# Patient Record
Sex: Female | Born: 1970 | Race: White | Hispanic: No | Marital: Married | Smoking: Never smoker
Health system: Southern US, Community
[De-identification: ages and names within clinical notes are randomized; demographics above are authoritative.]

## PROBLEM LIST (undated history)

## (undated) DIAGNOSIS — I493 Ventricular premature depolarization: Secondary | ICD-10-CM

## (undated) DIAGNOSIS — I10 Essential (primary) hypertension: Secondary | ICD-10-CM

## (undated) DIAGNOSIS — IMO0001 Reserved for inherently not codable concepts without codable children: Secondary | ICD-10-CM

## (undated) DIAGNOSIS — IMO0002 Reserved for concepts with insufficient information to code with codable children: Secondary | ICD-10-CM

## (undated) HISTORY — DX: Essential (primary) hypertension: I10

## (undated) HISTORY — PX: MOUTH SURGERY: SHX715

## (undated) HISTORY — DX: Reserved for inherently not codable concepts without codable children: IMO0001

## (undated) HISTORY — PX: WISDOM TOOTH EXTRACTION: SHX21

## (undated) HISTORY — DX: Reserved for concepts with insufficient information to code with codable children: IMO0002

## (undated) HISTORY — DX: Ventricular premature depolarization: I49.3

---

## 1997-08-18 ENCOUNTER — Other Ambulatory Visit: Admission: RE | Admit: 1997-08-18 | Discharge: 1997-08-18 | Payer: Self-pay | Admitting: Gynecology

## 1997-10-27 ENCOUNTER — Other Ambulatory Visit: Admission: RE | Admit: 1997-10-27 | Discharge: 1997-10-27 | Payer: Self-pay | Admitting: Gynecology

## 1998-05-13 ENCOUNTER — Other Ambulatory Visit: Admission: RE | Admit: 1998-05-13 | Discharge: 1998-05-13 | Payer: Self-pay | Admitting: Gynecology

## 1998-06-10 ENCOUNTER — Other Ambulatory Visit: Admission: RE | Admit: 1998-06-10 | Discharge: 1998-06-10 | Payer: Self-pay | Admitting: Gynecology

## 1999-07-06 ENCOUNTER — Other Ambulatory Visit: Admission: RE | Admit: 1999-07-06 | Discharge: 1999-07-06 | Payer: Self-pay | Admitting: Gynecology

## 1999-09-26 ENCOUNTER — Other Ambulatory Visit: Admission: RE | Admit: 1999-09-26 | Discharge: 1999-09-26 | Payer: Self-pay | Admitting: Gynecology

## 1999-11-08 ENCOUNTER — Other Ambulatory Visit: Admission: RE | Admit: 1999-11-08 | Discharge: 1999-11-08 | Payer: Self-pay | Admitting: Gynecology

## 1999-11-08 ENCOUNTER — Encounter (INDEPENDENT_AMBULATORY_CARE_PROVIDER_SITE_OTHER): Payer: Self-pay | Admitting: Specialist

## 2000-02-07 DIAGNOSIS — IMO0002 Reserved for concepts with insufficient information to code with codable children: Secondary | ICD-10-CM

## 2000-02-07 HISTORY — DX: Reserved for concepts with insufficient information to code with codable children: IMO0002

## 2000-02-07 HISTORY — PX: CERVICAL BIOPSY  W/ LOOP ELECTRODE EXCISION: SUR135

## 2000-05-31 ENCOUNTER — Other Ambulatory Visit: Admission: RE | Admit: 2000-05-31 | Discharge: 2000-05-31 | Payer: Self-pay | Admitting: Gynecology

## 2000-08-22 ENCOUNTER — Other Ambulatory Visit: Admission: RE | Admit: 2000-08-22 | Discharge: 2000-08-22 | Payer: Self-pay | Admitting: Gynecology

## 2000-08-22 ENCOUNTER — Encounter (INDEPENDENT_AMBULATORY_CARE_PROVIDER_SITE_OTHER): Payer: Self-pay

## 2001-10-30 ENCOUNTER — Other Ambulatory Visit: Admission: RE | Admit: 2001-10-30 | Discharge: 2001-10-30 | Payer: Self-pay | Admitting: Gynecology

## 2002-04-10 ENCOUNTER — Other Ambulatory Visit: Admission: RE | Admit: 2002-04-10 | Discharge: 2002-04-10 | Payer: Self-pay | Admitting: Gynecology

## 2002-11-11 ENCOUNTER — Other Ambulatory Visit: Admission: RE | Admit: 2002-11-11 | Discharge: 2002-11-11 | Payer: Self-pay | Admitting: Gynecology

## 2003-07-23 ENCOUNTER — Encounter: Admission: RE | Admit: 2003-07-23 | Discharge: 2003-10-21 | Payer: Self-pay | Admitting: Psychology

## 2003-12-23 ENCOUNTER — Other Ambulatory Visit: Admission: RE | Admit: 2003-12-23 | Discharge: 2003-12-23 | Payer: Self-pay | Admitting: Gynecology

## 2004-12-26 ENCOUNTER — Other Ambulatory Visit: Admission: RE | Admit: 2004-12-26 | Discharge: 2004-12-26 | Payer: Self-pay | Admitting: Gynecology

## 2005-10-19 ENCOUNTER — Encounter: Admission: RE | Admit: 2005-10-19 | Discharge: 2005-10-19 | Payer: Self-pay | Admitting: Internal Medicine

## 2006-01-02 ENCOUNTER — Other Ambulatory Visit: Admission: RE | Admit: 2006-01-02 | Discharge: 2006-01-02 | Payer: Self-pay | Admitting: Gynecology

## 2007-01-10 ENCOUNTER — Other Ambulatory Visit: Admission: RE | Admit: 2007-01-10 | Discharge: 2007-01-10 | Payer: Self-pay | Admitting: Gynecology

## 2008-09-30 ENCOUNTER — Other Ambulatory Visit: Admission: RE | Admit: 2008-09-30 | Discharge: 2008-09-30 | Payer: Self-pay | Admitting: Family Medicine

## 2009-12-20 ENCOUNTER — Other Ambulatory Visit: Admission: RE | Admit: 2009-12-20 | Discharge: 2009-12-20 | Payer: Self-pay | Admitting: Gynecology

## 2009-12-20 ENCOUNTER — Ambulatory Visit: Payer: Self-pay | Admitting: Gynecology

## 2010-02-09 ENCOUNTER — Ambulatory Visit: Admit: 2010-02-09 | Payer: Self-pay | Admitting: Gynecology

## 2010-02-18 ENCOUNTER — Ambulatory Visit: Admit: 2010-02-18 | Payer: Self-pay | Admitting: Gynecology

## 2010-02-23 ENCOUNTER — Ambulatory Visit
Admission: RE | Admit: 2010-02-23 | Discharge: 2010-02-23 | Payer: Self-pay | Source: Home / Self Care | Attending: Gynecology | Admitting: Gynecology

## 2010-02-23 DIAGNOSIS — IMO0001 Reserved for inherently not codable concepts without codable children: Secondary | ICD-10-CM

## 2010-02-23 HISTORY — DX: Reserved for inherently not codable concepts without codable children: IMO0001

## 2010-03-21 ENCOUNTER — Other Ambulatory Visit: Payer: Self-pay | Admitting: Dermatology

## 2010-03-24 ENCOUNTER — Ambulatory Visit (INDEPENDENT_AMBULATORY_CARE_PROVIDER_SITE_OTHER): Payer: 59 | Admitting: Gynecology

## 2010-03-24 DIAGNOSIS — IMO0002 Reserved for concepts with insufficient information to code with codable children: Secondary | ICD-10-CM

## 2010-11-07 HISTORY — PX: OTHER SURGICAL HISTORY: SHX169

## 2010-12-22 DIAGNOSIS — F988 Other specified behavioral and emotional disorders with onset usually occurring in childhood and adolescence: Secondary | ICD-10-CM | POA: Insufficient documentation

## 2010-12-22 DIAGNOSIS — IMO0002 Reserved for concepts with insufficient information to code with codable children: Secondary | ICD-10-CM | POA: Insufficient documentation

## 2010-12-28 ENCOUNTER — Encounter: Payer: 59 | Admitting: Gynecology

## 2011-02-14 ENCOUNTER — Encounter: Payer: 59 | Admitting: Gynecology

## 2011-02-14 ENCOUNTER — Encounter: Payer: Self-pay | Admitting: Gynecology

## 2011-02-14 ENCOUNTER — Ambulatory Visit (INDEPENDENT_AMBULATORY_CARE_PROVIDER_SITE_OTHER): Payer: BC Managed Care – PPO | Admitting: Gynecology

## 2011-02-14 ENCOUNTER — Other Ambulatory Visit (HOSPITAL_COMMUNITY)
Admission: RE | Admit: 2011-02-14 | Discharge: 2011-02-14 | Disposition: A | Discharge status: Home / Self Care | Payer: BC Managed Care – PPO | Source: Ambulatory Visit | Attending: Gynecology | Admitting: Gynecology

## 2011-02-14 VITALS — BP 122/76 | Ht 67.0 in | Wt 154.0 lb

## 2011-02-14 DIAGNOSIS — Z01419 Encounter for gynecological examination (general) (routine) without abnormal findings: Secondary | ICD-10-CM

## 2011-02-14 DIAGNOSIS — Z30431 Encounter for routine checking of intrauterine contraceptive device: Secondary | ICD-10-CM

## 2011-02-14 DIAGNOSIS — Z13220 Encounter for screening for lipoid disorders: Secondary | ICD-10-CM

## 2011-02-14 DIAGNOSIS — IMO0002 Reserved for concepts with insufficient information to code with codable children: Secondary | ICD-10-CM

## 2011-02-14 DIAGNOSIS — R87613 High grade squamous intraepithelial lesion on cytologic smear of cervix (HGSIL): Secondary | ICD-10-CM

## 2011-02-14 DIAGNOSIS — L680 Hirsutism: Secondary | ICD-10-CM

## 2011-02-14 DIAGNOSIS — Z131 Encounter for screening for diabetes mellitus: Secondary | ICD-10-CM

## 2011-02-14 DIAGNOSIS — Z23 Encounter for immunization: Secondary | ICD-10-CM

## 2011-02-14 LAB — URINALYSIS, ROUTINE W REFLEX MICROSCOPIC
Glucose, UA: NEGATIVE mg/dL
Hgb urine dipstick: NEGATIVE
Leukocytes, UA: NEGATIVE
Specific Gravity, Urine: 1.02 (ref 1.005–1.030)
pH: 6 (ref 5.0–8.0)

## 2011-02-14 NOTE — Progress Notes (Signed)
Addended by: Richardson Chiquito on: 02/14/2011 04:48 PM   Modules accepted: Orders

## 2011-02-14 NOTE — Progress Notes (Addendum)
Emma Barajas 31-Jan-1971 [id number]        41 y.o.  for annual exam.  Several issues noted below.  Past medical history,surgical history, medications, allergies, family history and social history were all reviewed and documented in the EPIC chart. ROS:  Was performed and pertinent positives and negatives are included in the history.  Exam: Sherri chaperone present Filed Vitals:   02/14/11 1018  BP: 122/76   General appearance  Normal Skin grossly normal Head/Neck normal with no cervical or supraclavicular adenopathy thyroid normal Lungs  clear Cardiac RR, without RMG Abdominal  soft, nontender, without masses, organomegaly or hernia Breasts  examined lying and sitting without masses, retractions, discharge or axillary adenopathy. Pelvic  Ext/BUS/vagina  normal   Cervix  normal  Pap done. IUD string visualized and trimmed   Uterus  anteverted, normal size, shape and contour, midline and mobile nontender   Adnexa  Without masses or tenderness    Anus and perineum  normal   Rectovaginal  normal sphincter tone without palpated masses or tenderness.    Assessment/Plan:  41 y.o. female for annual exam.    1. IUDs management. Patient relates her husband feels the IUD string with intercourse. On exam the string is right at the external os. Using the colposcope and biopsy instrument I trimmed the string into the opening of the external os. Patient will follow up if her husband continues to have discomfort. Otherwise she is doing well with the IUD. 2. 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 Her intervening Pap smears have been normal. A Pap was done today and will continue with annual cytology. 3. Facial hair growth. Patient notes in the last 6 months some increased facial chin and upper lip hair growth. No other symptoms such as scalp hair loss, chest hair, increased acne, weight gain. I suspect this is more physiologic. It may be related to the  newer IUD placement although she did not have this issue with her prior IUD. We'll check baseline TSH and testosterone. Assuming normal then we'll follow. If it worsens then we'll consider referral. 4. Breast health. SBE monthly reviewed. Mammogram last year and recommended repeat this year and she agrees to schedule this. 5. Health maintenance. Baseline CBC glucose lipid profile and urinalysis were ordered. Assuming she continues well then she will see me in a year, sooner as needed.    [name] MD, 10:48 AM 02/14/2011

## 2011-02-14 NOTE — Patient Instructions (Signed)
Follow up if IUD string continues to be an issue. Schedule mammogram this year and follow up in one year for annual GYN exam.

## 2011-02-15 LAB — CBC WITH DIFFERENTIAL/PLATELET
Basophils Absolute: 0 10*3/uL (ref 0.0–0.1)
Eosinophils Absolute: 0.1 10*3/uL (ref 0.0–0.7)
Lymphocytes Relative: 38 % (ref 12–46)
Lymphs Abs: 1.5 10*3/uL (ref 0.7–4.0)
MCH: 29.9 pg (ref 26.0–34.0)
Neutrophils Relative %: 50 % (ref 43–77)
Platelets: 211 10*3/uL (ref 150–400)
RBC: 4.25 MIL/uL (ref 3.87–5.11)
RDW: 14.4 % (ref 11.5–15.5)
WBC: 4.1 10*3/uL (ref 4.0–10.5)

## 2011-02-15 LAB — GLUCOSE, RANDOM: Glucose, Bld: 75 mg/dL (ref 70–99)

## 2011-02-15 LAB — TESTOSTERONE: Testosterone: 43.7 ng/dL (ref 10–70)

## 2011-02-15 LAB — TSH: TSH: 0.584 u[IU]/mL (ref 0.350–4.500)

## 2011-02-15 LAB — LIPID PANEL: HDL: 92 mg/dL (ref >39)

## 2011-04-21 ENCOUNTER — Encounter: Payer: Self-pay | Admitting: Gynecology

## 2012-02-26 ENCOUNTER — Encounter: Payer: Self-pay | Admitting: Gynecology

## 2012-02-26 ENCOUNTER — Other Ambulatory Visit (HOSPITAL_COMMUNITY)
Admission: RE | Admit: 2012-02-26 | Discharge: 2012-02-26 | Disposition: A | Discharge status: Home / Self Care | Payer: BC Managed Care – PPO | Source: Ambulatory Visit | Attending: Gynecology | Admitting: Gynecology

## 2012-02-26 ENCOUNTER — Ambulatory Visit (INDEPENDENT_AMBULATORY_CARE_PROVIDER_SITE_OTHER): Payer: BC Managed Care – PPO | Admitting: Gynecology

## 2012-02-26 VITALS — BP 120/74 | Ht 67.0 in | Wt 155.0 lb

## 2012-02-26 DIAGNOSIS — Z01419 Encounter for gynecological examination (general) (routine) without abnormal findings: Secondary | ICD-10-CM

## 2012-02-26 DIAGNOSIS — Z1151 Encounter for screening for human papillomavirus (HPV): Secondary | ICD-10-CM | POA: Insufficient documentation

## 2012-02-26 DIAGNOSIS — Z30431 Encounter for routine checking of intrauterine contraceptive device: Secondary | ICD-10-CM

## 2012-02-26 DIAGNOSIS — Z13220 Encounter for screening for lipoid disorders: Secondary | ICD-10-CM

## 2012-02-26 DIAGNOSIS — R1013 Epigastric pain: Secondary | ICD-10-CM

## 2012-02-26 LAB — CBC WITH DIFFERENTIAL/PLATELET
Basophils Absolute: 0.1 10*3/uL (ref 0.0–0.1)
Lymphocytes Relative: 24 % (ref 12–46)
Lymphs Abs: 1.8 10*3/uL (ref 0.7–4.0)
MCV: 86.6 fL (ref 78.0–100.0)
Neutro Abs: 5.2 10*3/uL (ref 1.7–7.7)
Neutrophils Relative %: 67 % (ref 43–77)
Platelets: 243 10*3/uL (ref 150–400)
RBC: 4.76 MIL/uL (ref 3.87–5.11)
RDW: 15.1 % (ref 11.5–15.5)
WBC: 7.6 10*3/uL (ref 4.0–10.5)

## 2012-02-26 LAB — LIPID PANEL
Cholesterol: 198 mg/dL (ref 0–200)
Total CHOL/HDL Ratio: 2.9 Ratio
VLDL: 22 mg/dL (ref 0–40)

## 2012-02-26 LAB — COMPREHENSIVE METABOLIC PANEL
ALT: 11 U/L (ref 0–35)
AST: 19 U/L (ref 0–37)
CO2: 26 mEq/L (ref 19–32)
Calcium: 9.6 mg/dL (ref 8.4–10.5)
Chloride: 100 mEq/L (ref 96–112)
Potassium: 4.6 mEq/L (ref 3.5–5.3)
Sodium: 137 mEq/L (ref 135–145)
Total Protein: 7.3 g/dL (ref 6.0–8.3)

## 2012-02-26 NOTE — Progress Notes (Signed)
Emma Barajas 05-09-1970 [id number]        42 y.o.  G0P0 for annual exam.  Several issues noted below.  Past medical history,surgical history, medications, allergies, family history and social history were all reviewed and documented in the EPIC chart. ROS:  Was performed and pertinent positives and negatives are included in the history.  Exam: [name] Filed Vitals:   02/26/12 0851  BP: 120/74  Height: 5\' 7"  (1.702 m)  Weight: 155 lb (70.308 kg)   General appearance  Normal Skin grossly normal Head/Neck normal with no cervical or supraclavicular adenopathy thyroid normal Lungs  clear Cardiac RR, without RMG Abdominal  soft, nontender, without masses, organomegaly or hernia Breasts  examined lying and sitting without masses, retractions, discharge or axillary adenopathy. Pelvic  Ext/BUS/vagina  normal   Cervix  normal with IUD string visualized./HPV  Uterus  anteverted, normal size, shape and contour, midline and mobile nontender   Adnexa  Without masses or tenderness    Anus and perineum  normal   Rectovaginal  normal sphincter tone without palpated masses or tenderness.    Assessment/Plan:  42 y.o. G0P0 female for annual exam.   1. Mirena IUD January 2012. Doing well with light menses. Continue to follow. 2. Pap smear January 2013. Pap/HPV done today given history of high-grade dysplasia 2006 status post the LEEP with normal Pap smears since. Assuming this Paps normal them plan less frequent screening interval. 3. Mammography 04/2011. Follow up mammogram this year. SBE monthly reviewed. 4. Episodic epigastric pain times several episodes. Lasting hours today. Cramping bloating. May follow meals although hasn't time to. No continued nausea vomiting diarrhea constipation fever chills or other symptoms. Check baseline liver functions and upper abdominal ultrasound rule out cholelithiasis. If evidence of cholelithiasis referral to general surgery. If negative referral to  gastroenterology. Alternatives such as GERD/ulcerative disease or other pathology reviewed. 5. Health maintenance. Baseline CBC comprehensive metabolic panel lipid profile urinalysis ordered. Follow up per above.    Emma Barajas, 9:26 AM 02/26/2012

## 2012-02-26 NOTE — Addendum Note (Signed)
Addended by: Dayna Barker on: 02/26/2012 09:49 AM   Modules accepted: Orders

## 2012-02-26 NOTE — Patient Instructions (Signed)
Office will call to arrange abdominal ultrasound. Follow up for annual gynecologic exam

## 2012-02-27 LAB — URINALYSIS W MICROSCOPIC + REFLEX CULTURE
Bilirubin Urine: NEGATIVE
Crystals: NONE SEEN
Glucose, UA: NEGATIVE mg/dL
Leukocytes, UA: NEGATIVE
Protein, ur: NEGATIVE mg/dL
Specific Gravity, Urine: 1.019 (ref 1.005–1.030)
Squamous Epithelial / LPF: NONE SEEN
Urobilinogen, UA: 0.2 mg/dL (ref 0.0–1.0)

## 2012-03-23 ENCOUNTER — Other Ambulatory Visit: Payer: Self-pay

## 2012-04-23 ENCOUNTER — Encounter: Payer: Self-pay | Admitting: Gynecology

## 2012-12-12 ENCOUNTER — Other Ambulatory Visit: Payer: Self-pay

## 2013-03-21 ENCOUNTER — Ambulatory Visit (INDEPENDENT_AMBULATORY_CARE_PROVIDER_SITE_OTHER): Payer: BC Managed Care – PPO | Admitting: Gynecology

## 2013-03-21 ENCOUNTER — Encounter: Payer: Self-pay | Admitting: Gynecology

## 2013-03-21 VITALS — BP 116/72 | Ht 67.0 in | Wt 156.0 lb

## 2013-03-21 DIAGNOSIS — Z01419 Encounter for gynecological examination (general) (routine) without abnormal findings: Secondary | ICD-10-CM

## 2013-03-21 LAB — COMPREHENSIVE METABOLIC PANEL
ALBUMIN: 4.3 g/dL (ref 3.5–5.2)
ALT: 8 U/L (ref 0–35)
AST: 17 U/L (ref 0–37)
Alkaline Phosphatase: 58 U/L (ref 39–117)
BUN: 15 mg/dL (ref 6–23)
CALCIUM: 9.1 mg/dL (ref 8.4–10.5)
CHLORIDE: 104 meq/L (ref 96–112)
CO2: 26 mEq/L (ref 19–32)
Creat: 0.72 mg/dL (ref 0.50–1.10)
Glucose, Bld: 60 mg/dL — ABNORMAL LOW (ref 70–99)
POTASSIUM: 3.8 meq/L (ref 3.5–5.3)
SODIUM: 141 meq/L (ref 135–145)
Total Bilirubin: 0.3 mg/dL (ref 0.2–1.2)
Total Protein: 6.6 g/dL (ref 6.0–8.3)

## 2013-03-21 LAB — LIPID PANEL
Cholesterol: 168 mg/dL (ref 0–200)
HDL: 71 mg/dL (ref >39)
LDL CALC: 73 mg/dL (ref 0–99)
Total CHOL/HDL Ratio: 2.4 Ratio
Triglycerides: 118 mg/dL (ref <150)
VLDL: 24 mg/dL (ref 0–40)

## 2013-03-21 LAB — CBC WITH DIFFERENTIAL/PLATELET
BASOS ABS: 0 10*3/uL (ref 0.0–0.1)
Basophils Relative: 1 % (ref 0–1)
Eosinophils Absolute: 0.1 10*3/uL (ref 0.0–0.7)
Eosinophils Relative: 1 % (ref 0–5)
HEMATOCRIT: 40.3 % (ref 36.0–46.0)
Hemoglobin: 13.3 g/dL (ref 12.0–15.0)
LYMPHS PCT: 40 % (ref 12–46)
Lymphs Abs: 1.8 10*3/uL (ref 0.7–4.0)
MCH: 29.3 pg (ref 26.0–34.0)
MCHC: 33 g/dL (ref 30.0–36.0)
MCV: 88.8 fL (ref 78.0–100.0)
Monocytes Absolute: 0.3 10*3/uL (ref 0.1–1.0)
Monocytes Relative: 7 % (ref 3–12)
NEUTROS ABS: 2.3 10*3/uL (ref 1.7–7.7)
NEUTROS PCT: 51 % (ref 43–77)
PLATELETS: 216 10*3/uL (ref 150–400)
RBC: 4.54 MIL/uL (ref 3.87–5.11)
RDW: 15.1 % (ref 11.5–15.5)
WBC: 4.5 10*3/uL (ref 4.0–10.5)

## 2013-03-21 NOTE — Patient Instructions (Signed)
Follow up in one year for annual exam 

## 2013-03-21 NOTE — Progress Notes (Addendum)
Emma ChurnLea Barajas 03/27/1970 [id number]        43 y.o.  G0P0 for annual exam.  Doing well without complaints.  Past medical history,surgical history, problem list, medications, allergies, family history and social history were all reviewed and documented in the EPIC chart.  ROS:  Performed and pertinent positives and negatives are included in the history, assessment and plan .  Exam: [name] Filed Vitals:   03/21/13 0827  BP: 116/72  Height: 5\' 7"  (1.702 m)  Weight: 156 lb (70.761 kg)   General appearance  Normal Skin grossly normal Head/Neck normal with no cervical or supraclavicular adenopathy thyroid normal Lungs  clear Cardiac premature beats every third to fourth beat, without RMG Abdominal  soft, nontender, without masses, organomegaly or hernia Breasts  examined lying and sitting without masses, retractions, discharge or axillary adenopathy. Pelvic  Ext/BUS/vagina  Normal  Cervix  Normal. IUD string visualized within endocervical canal using colposcope  Uterus  anteverted, normal size, shape and contour, midline and mobile nontender   Adnexa  Without masses or tenderness    Anus and perineum  Normal   Rectovaginal  Normal sphincter tone without palpated masses or tenderness.    Assessment/Plan:  43 y.o. G0P0 female for annual exam amenorrhea, Mirena IUD.   1. Mirena IUD 02/2010. String visualized within the endocervical canal. Doing well and amenorrheic. Continue to monitor. 2. Pap smear/HPV negative 02/2012. No Pap smear done today. History of high-grade dysplasia with LEEP 2002. Negative Pap smears since then. Plan repeat at 3 year interval. 3. Mammography 04/2012. Patient has it scheduled next month. SBE monthly reviewed. 4. Health maintenance. Baseline CBC comprehensive metabolic panel lipid profile urinalysis ordered. Followup one year, sooner as needed.  Addendum to above: Patient did have some premature beats on cardiac auscultation and pulse monitoring. We'll have  her see a cardiologist for followup exam as she's never been told this before.   Note: This document was prepared with digital dictation and possible smart phrase technology. Any transcriptional errors that result from this process are unintentional.   Dara LordsFONTAINE,TIMOTHY P MD, 8:50 AM 03/21/2013

## 2013-03-22 LAB — URINALYSIS W MICROSCOPIC + REFLEX CULTURE
Bilirubin Urine: NEGATIVE
Casts: NONE SEEN
Crystals: NONE SEEN
GLUCOSE, UA: NEGATIVE mg/dL
Ketones, ur: NEGATIVE mg/dL
NITRITE: POSITIVE — AB
PH: 6 (ref 5.0–8.0)
PROTEIN: NEGATIVE mg/dL
SQUAMOUS EPITHELIAL / LPF: NONE SEEN
Specific Gravity, Urine: 1.008 (ref 1.005–1.030)
Urobilinogen, UA: 0.2 mg/dL (ref 0.0–1.0)

## 2013-03-23 LAB — URINE CULTURE: Colony Count: 2000

## 2013-03-24 ENCOUNTER — Other Ambulatory Visit: Payer: Self-pay | Admitting: Gynecology

## 2013-03-24 ENCOUNTER — Other Ambulatory Visit: Payer: BC Managed Care – PPO

## 2013-03-24 ENCOUNTER — Telehealth: Payer: Self-pay

## 2013-03-24 DIAGNOSIS — R3129 Other microscopic hematuria: Secondary | ICD-10-CM

## 2013-03-24 NOTE — Telephone Encounter (Signed)
Patient said when she was in that you mentioned that her heart did not sound completely normal.  She said you told her she should get it checked out and she is not sure what that entails or what you wanted her to do.

## 2013-03-24 NOTE — Telephone Encounter (Signed)
She is correct. She did seem to have some premature beats in her heart rhythm. Recommend she see a cardiologist for followup. [facility]

## 2013-03-24 NOTE — Telephone Encounter (Signed)
I called to schedule appt with [facility] [phone] but they have already closed due to inclement weather and will reopen on Weds at 10am.  I informed patient of this and that I will try to contact them again on Weds and get her scheduled.

## 2013-03-25 LAB — URINALYSIS W MICROSCOPIC + REFLEX CULTURE
BACTERIA UA: NONE SEEN
Bilirubin Urine: NEGATIVE
CASTS: NONE SEEN
CRYSTALS: NONE SEEN
Glucose, UA: NEGATIVE mg/dL
Hgb urine dipstick: NEGATIVE
LEUKOCYTES UA: NEGATIVE
NITRITE: NEGATIVE
PH: 7 (ref 5.0–8.0)
Protein, ur: NEGATIVE mg/dL
SPECIFIC GRAVITY, URINE: 1.019 (ref 1.005–1.030)
SQUAMOUS EPITHELIAL / LPF: NONE SEEN
UROBILINOGEN UA: 0.2 mg/dL (ref 0.0–1.0)

## 2013-03-26 NOTE — Telephone Encounter (Signed)
Appt scheduled for tomorrow at 2:30pm with Dr. Tobias AlexanderKatarina Barajas.  Patient should arrive by 2:15pm. Patient informed.

## 2013-03-27 ENCOUNTER — Encounter: Payer: Self-pay | Admitting: Cardiology

## 2013-03-27 ENCOUNTER — Ambulatory Visit (INDEPENDENT_AMBULATORY_CARE_PROVIDER_SITE_OTHER): Payer: BC Managed Care – PPO | Admitting: Cardiology

## 2013-03-27 VITALS — BP 128/68 | HR 77 | Ht 67.0 in | Wt 158.0 lb

## 2013-03-27 DIAGNOSIS — I4949 Other premature depolarization: Secondary | ICD-10-CM

## 2013-03-27 DIAGNOSIS — I493 Ventricular premature depolarization: Secondary | ICD-10-CM | POA: Insufficient documentation

## 2013-03-27 NOTE — Patient Instructions (Signed)
Your physician has recommended that you wear a 48 hour holter monitor. Holter monitors are medical devices that record the heart's electrical activity. Doctors most often use these monitors to diagnose arrhythmias. Arrhythmias are problems with the speed or rhythm of the heartbeat. The monitor is a small, portable device. You can wear one while you do your normal daily activities. This is usually used to diagnose what is causing palpitations/syncope (passing out).  Your physician recommends that you schedule a follow-up appointment in: as needed

## 2013-03-27 NOTE — Progress Notes (Signed)
Patient ID: Emma Barajas, female   DOB: [birth year], 43 y.o.   MRN: [id number]     Patient Name: Emma Barajas Date of Encounter: 03/27/2013  Primary Care Provider:  [name], MD Primary Cardiologist:  Tobias Alexander, H  Problem List   Past Medical History  Diagnosis Date  . IUD 02-23-10    ORIGINALLY INSERTED 01/09/05 AND REPLACE ON 02-23-10  . ADD (attention deficit disorder)   . HGSIL (high grade squamous intraepithelial dysplasia) 2002    LEEP   Past Surgical History  Procedure Laterality Date  . Cervical biopsy  w/ loop electrode excision  2002    HGSIL  . Wisdom tooth extraction    . Lasix eye surgery  11/2010  . Intrauterine device insertion  02/2010    Allergies  Allergies  Allergen Reactions  . Other Shortness Of Breath    "PARSNIPS"    HPI  A healthy appearing 43 year old female who was referred to [place] for a concern of PVCs. The patient has no significant PMH. She recently underwent an annual physical by her OB-GYN and was told that she has irregular heart beats. She states that she was also told 18 years ago that she has a murmur. The patient denies any symptoms, she is very active, preparing for a 50 mile walk, currently walking 5 miles daily without any limitations. She denies CP, SOB, palpitations or syncope.   Home Medications  Prior to Admission medications   Medication Sig Start Date End Date Taking? Authorizing Provider  Calcium Carbonate-Vitamin D (CALCIUM + D PO) Take by mouth.   Yes Historical Provider, MD  Coenzyme Q10 (COQ10 PO) Take by mouth.   Yes Historical Provider, MD  levonorgestrel (MIRENA) 20 MCG/24HR IUD 1 each by Intrauterine route once. INSERTED JAN 2012    Yes Historical Provider, MD  Multiple Vitamin (MULTIVITAMIN) tablet Take 1 tablet by mouth daily.   Yes Historical Provider, MD  Omega-3 Fatty Acids (FISH OIL PO) Take by mouth.     Yes Historical Provider, MD  OVER THE COUNTER MEDICATION reservatol     Yes Historical  Provider, MD  valACYclovir (VALTREX) 1000 MG tablet  02/02/13  Yes Historical Provider, MD    Family History  Family History  Problem Relation Age of Onset  . Hyperlipidemia Mother   . Cancer Father     PROSTATE  . Hypertension Father   . Hyperlipidemia Father   . Diabetes Father   . Heart attack Maternal Grandmother     Social History  History   Social History  . Marital Status: Married    Spouse Name: N/A    Number of Children: N/A  . Years of Education: N/A   Occupational History  . Not on file.   Social History Main Topics  . Smoking status: Never Smoker   . Smokeless tobacco: Never Used  . Alcohol Use: 7.5 oz/week    15 drink(s) per week  . Drug Use: No  . Sexual Activity: Yes    Birth Control/ Protection: IUD     Comment: Inserted 02/2010-Mirena   Other Topics Concern  . Not on file   Social History Narrative  . No narrative on file     Review of Systems, as per HPI, otherwise negative General:  No chills, fever, night sweats or weight changes.  Cardiovascular:  No chest pain, dyspnea on exertion, edema, orthopnea, palpitations, paroxysmal nocturnal dyspnea. Dermatological: No rash, lesions/masses Respiratory: No cough, dyspnea Urologic: No hematuria, dysuria Abdominal:  No nausea, vomiting, diarrhea, bright red blood per rectum, melena, or hematemesis Neurologic:  No visual changes, wkns, changes in mental status. All other systems reviewed and are otherwise negative except as noted above.  Physical Exam  Blood pressure 128/68, pulse 77, height 5\' 7"  (1.702 m), weight 158 lb (71.668 kg), last menstrual period 03/09/2013.  General: Pleasant, NAD Psych: Normal affect. Neuro: Alert and oriented X 3. Moves all extremities spontaneously. HEENT: Normal  Neck: Supple without bruits or JVD. Lungs:  Resp regular and unlabored, CTA. Heart: RRR no s3, s4, or murmurs. Abdomen: Soft, non-tender, non-distended, BS + x 4.  Extremities: No clubbing, cyanosis  or edema. DP/PT/Radials 2+ and equal bilaterally.  Labs:  No results found for this basename: CKTOTAL, CKMB, TROPONINI,  in the last 72 hours Lab Results  Component Value Date   WBC 4.5 03/21/2013   HGB 13.3 03/21/2013   HCT 40.3 03/21/2013   MCV 88.8 03/21/2013   PLT 216 03/21/2013    Recent Labs Lab 03/21/13 0853  NA 141  K 3.8  CL 104  CO2 26  BUN 15  CREATININE 0.72  CALCIUM 9.1  PROT 6.6  BILITOT 0.3  ALKPHOS 58  ALT 8  AST 17  GLUCOSE 60*   Lab Results  Component Value Date   CHOL 168 03/21/2013   HDL 71 03/21/2013   LDLCALC 73 03/21/2013   TRIG 118 03/21/2013   Accessory Clinical Findings  Echocardiogram - none  ECG - Normal SR, normal ECG   Assessment & Plan  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 Normal ECG, normal physical exam. We will order a 48 hour Holter monitor to evaluate for PVC burden and any other arrhythmias. We will call the patient with the results and only schedule a follow up appointment if necessary.   Lars MassonNELSON, Abdulai Blaylock, H, MD, [facility] 03/27/2013, 2:49 PM

## 2013-03-28 NOTE — Addendum Note (Signed)
Addended by: Demetrios LollVIA, PATRICIA M on: 03/28/2013 08:55 AM   Modules accepted: Orders

## 2013-04-07 ENCOUNTER — Encounter: Payer: Self-pay | Admitting: *Deleted

## 2013-04-07 ENCOUNTER — Encounter (INDEPENDENT_AMBULATORY_CARE_PROVIDER_SITE_OTHER): Payer: BC Managed Care – PPO

## 2013-04-07 DIAGNOSIS — I4949 Other premature depolarization: Secondary | ICD-10-CM

## 2013-04-07 DIAGNOSIS — I493 Ventricular premature depolarization: Secondary | ICD-10-CM

## 2013-04-07 NOTE — Progress Notes (Signed)
Patient ID: Emma Barajas, female   DOB: 08-15-1970, 43 y.o.   MRN: [id number] EVO 48 hour holter monitor applied to patient.

## 2013-04-28 ENCOUNTER — Encounter: Payer: Self-pay | Admitting: Gynecology

## 2013-04-30 ENCOUNTER — Telehealth: Payer: Self-pay

## 2013-04-30 DIAGNOSIS — I4949 Other premature depolarization: Secondary | ICD-10-CM

## 2013-04-30 NOTE — Telephone Encounter (Addendum)
**Note De-Identified Emma Barajas Obfuscation** Lest detailed message on the pts VM.  Per Dr Delton SeeNelson this pt had frequent PVC's while wearing holter monitor and she is recommending that the pt have an Echo.

## 2013-05-16 ENCOUNTER — Other Ambulatory Visit (HOSPITAL_COMMUNITY): Payer: BC Managed Care – PPO

## 2013-05-22 ENCOUNTER — Other Ambulatory Visit (HOSPITAL_COMMUNITY): Payer: BC Managed Care – PPO

## 2013-06-06 ENCOUNTER — Ambulatory Visit (HOSPITAL_COMMUNITY): Payer: BC Managed Care – PPO | Attending: Internal Medicine | Admitting: Radiology

## 2013-06-06 DIAGNOSIS — I4949 Other premature depolarization: Secondary | ICD-10-CM

## 2013-06-06 DIAGNOSIS — I493 Ventricular premature depolarization: Secondary | ICD-10-CM

## 2013-06-06 NOTE — Progress Notes (Signed)
Echocardiogram performed.  

## 2013-06-11 ENCOUNTER — Telehealth: Payer: Self-pay | Admitting: Cardiology

## 2013-06-11 NOTE — Telephone Encounter (Signed)
Contacted pt about results.  Pt was wanting to know how her echo results from 5/1 were. Advised pt that the preliminary results are complete, but Dr Delton SeeNelson has not reviewed the results yet. Advised pt of preliminary results. Told pt after Dr Delton SeeNelson gives her final review, then we will contact her and further advise.  Pt verbalizes understanding and pleased with call back.

## 2013-06-11 NOTE — Telephone Encounter (Signed)
Patient would like results of EKG, please call & advise.

## 2013-06-23 ENCOUNTER — Telehealth: Payer: Self-pay

## 2013-06-23 NOTE — Telephone Encounter (Signed)
**Note De-identified Chaska Hagger Obfuscation** The pt is advised, she verbalized understanding. 

## 2013-06-23 NOTE — Telephone Encounter (Signed)
Message copied by Demetrios LollVIA, PATRICIA M on Mon Jun 23, 2013  2:07 PM ------      Message from: Lars MassonNELSON, KATARINA H      Created: Fri Jun 20, 2013  2:09 PM       Normal echocardiogram, would you call her? ------

## 2013-11-21 ENCOUNTER — Other Ambulatory Visit: Payer: Self-pay

## 2014-03-27 ENCOUNTER — Encounter: Payer: Self-pay | Admitting: Gynecology

## 2014-04-02 ENCOUNTER — Encounter: Payer: Self-pay | Admitting: Gynecology

## 2014-05-18 ENCOUNTER — Encounter: Payer: Self-pay | Admitting: Gynecology

## 2014-09-16 ENCOUNTER — Other Ambulatory Visit: Payer: Self-pay | Admitting: Dermatology

## 2014-10-29 ENCOUNTER — Encounter: Payer: Self-pay | Admitting: Gynecology

## 2014-10-29 ENCOUNTER — Ambulatory Visit (INDEPENDENT_AMBULATORY_CARE_PROVIDER_SITE_OTHER): Payer: No Typology Code available for payment source | Admitting: Gynecology

## 2014-10-29 VITALS — BP 138/90 | Ht 67.0 in | Wt 159.6 lb

## 2014-10-29 DIAGNOSIS — Z30431 Encounter for routine checking of intrauterine contraceptive device: Secondary | ICD-10-CM

## 2014-10-29 DIAGNOSIS — R140 Abdominal distension (gaseous): Secondary | ICD-10-CM

## 2014-10-29 DIAGNOSIS — Z01419 Encounter for gynecological examination (general) (routine) without abnormal findings: Secondary | ICD-10-CM

## 2014-10-29 DIAGNOSIS — N926 Irregular menstruation, unspecified: Secondary | ICD-10-CM | POA: Diagnosis not present

## 2014-10-29 LAB — COMPREHENSIVE METABOLIC PANEL
ALBUMIN: 4.3 g/dL (ref 3.6–5.1)
ALT: 8 U/L (ref 6–29)
AST: 17 U/L (ref 10–35)
Alkaline Phosphatase: 55 U/L (ref 33–115)
BUN: 12 mg/dL (ref 7–25)
CALCIUM: 9.5 mg/dL (ref 8.6–10.2)
CHLORIDE: 105 mmol/L (ref 98–110)
CO2: 24 mmol/L (ref 20–31)
Creat: 0.6 mg/dL (ref 0.50–1.10)
Glucose, Bld: 81 mg/dL (ref 65–99)
Potassium: 4.9 mmol/L (ref 3.5–5.3)
Sodium: 141 mmol/L (ref 135–146)
Total Bilirubin: 0.6 mg/dL (ref 0.2–1.2)
Total Protein: 7 g/dL (ref 6.1–8.1)

## 2014-10-29 LAB — CBC WITH DIFFERENTIAL/PLATELET
Basophils Absolute: 0 10*3/uL (ref 0.0–0.1)
Basophils Relative: 0 % (ref 0–1)
Eosinophils Absolute: 0.1 10*3/uL (ref 0.0–0.7)
Eosinophils Relative: 2 % (ref 0–5)
HEMATOCRIT: 39.7 % (ref 36.0–46.0)
HEMOGLOBIN: 13.1 g/dL (ref 12.0–15.0)
LYMPHS PCT: 24 % (ref 12–46)
Lymphs Abs: 1.3 10*3/uL (ref 0.7–4.0)
MCH: 29.4 pg (ref 26.0–34.0)
MCHC: 33 g/dL (ref 30.0–36.0)
MCV: 89.2 fL (ref 78.0–100.0)
MONO ABS: 0.4 10*3/uL (ref 0.1–1.0)
MONOS PCT: 8 % (ref 3–12)
MPV: 9.4 fL (ref 8.6–12.4)
NEUTROS ABS: 3.6 10*3/uL (ref 1.7–7.7)
Neutrophils Relative %: 66 % (ref 43–77)
Platelets: 202 10*3/uL (ref 150–400)
RBC: 4.45 MIL/uL (ref 3.87–5.11)
RDW: 14.8 % (ref 11.5–15.5)
WBC: 5.5 10*3/uL (ref 4.0–10.5)

## 2014-10-29 LAB — LIPID PANEL
Cholesterol: 188 mg/dL (ref 125–200)
HDL: 71 mg/dL (ref >=46)
LDL CALC: 99 mg/dL (ref <130)
TRIGLYCERIDES: 89 mg/dL (ref <150)
Total CHOL/HDL Ratio: 2.6 Ratio (ref <=5.0)
VLDL: 18 mg/dL (ref <30)

## 2014-10-29 LAB — TSH: TSH: 1.398 u[IU]/mL (ref 0.350–4.500)

## 2014-10-29 NOTE — Progress Notes (Signed)
Zaylie Gisler April 14, 1970 [id number]        44 y.o.  G0P0 for annual exam.  Several issues noted below.  Past medical history,surgical history, problem list, medications, allergies, family history and social history were all reviewed and documented as reviewed in the EPIC chart.  ROS:  Performed with pertinent positives and negatives included in the history, assessment and plan.   Additional significant findings :  none   Exam: Charity fundraiser Vitals:   10/29/14 0852  BP: 138/90  Height:  (1.702 m)  Weight: 159 lb 9.6 oz (72.394 kg)   General appearance:  Normal affect, orientation and appearance. Skin: Grossly normal HEENT: Without gross lesions.  No cervical or supraclavicular adenopathy. Thyroid normal.  Lungs:  Clear without wheezing, rales or rhonchi Cardiac: RR, without RMG Abdominal:  Soft, nontender, without masses, guarding, rebound, organomegaly or hernia Breasts:  Examined lying and sitting without masses, retractions, discharge or axillary adenopathy. Pelvic:  Ext/BUS/vagina normal  Cervix normal. IUD string not visualized  Uterus axial to anteverted, normal size, shape and contour, midline and mobile nontender   Adnexa  Without masses or tenderness    Anus and perineum  Normal   Rectovaginal  Normal sphincter tone without palpated masses or tenderness.    Assessment/Plan:  44 y.o. G0P0 female for annual exam with irregular bleeding, Mirena IUD.   1. Irregular bleeding. Patient notes that she was having regular menses with her Mirena IUD until June where now she is sporadically bleeding on and off. Also noting abdominal bloating and just feeling discomfort. No constipation diarrhea or urinary symptoms such as frequency dysuria or urgency. IUD string not visualized today as in the past. Will start with ultrasound for IUD placement and pelvic assessment. Baseline CBC comprehensive metabolic panel FSH TSH prolactin due to the irregular bleeding. We'll follow  with results and then discuss further plans. 2. Mirena IUD 02/2010. Follow up for ultrasound for placement. Need to be replaced or to discuss alternatives this coming January. Patient unclear whether she wants another IUD or will consider other options. Possible low-dose oral contraceptives from a hormone replacement standpoint as well as menstrual regulation/contraception discussed. 3. Mammography 04/2013. Patient knows she is overdue and agrees to call and schedule. SBE monthly reviewed. 4. Pap smear/HPV 2014 negative.  No Pap smear done today. History of LEEP 2002. Will plan follow up Pap smear next year. 5. Blood pressure 138/90. Discussed with patient. She is actually giving blood next week and will check it at that point. She'll follow up as an outpatient if remains elevated she knows need to follow up with primary physician for evaluation and treatment. 6. Health maintenance. Baseline CBC comprehensive metabolic panel lipid profile urinalysis TSH FSH prolactin ordered. Follow up for ultrasound as scheduled.   [name] MD, 9:20 AM 10/29/2014

## 2014-10-29 NOTE — Patient Instructions (Signed)
Follow up for ultrasound as scheduled.  Schedule your mammogram.  Repeat and follow your blood pressure. If it remains elevated then you'll need to see a primary physician.  You may obtain a copy of any labs that were done today by logging onto MyChart as outlined in the instructions provided with your AVS (after visit summary). The office will not call with normal lab results but certainly if there are any significant abnormalities then we will contact you.   Health Maintenance Adopting a healthy lifestyle and getting preventive care can go a long way to promote health and wellness. Talk with your health care Mercadez Heitman about what schedule of regular examinations is right for you. This is a good chance for you to check in with your Vickey Boak about disease prevention and staying healthy. In between checkups, there are plenty of things you can do on your own. Experts have done a lot of research about which lifestyle changes and preventive measures are most likely to keep you healthy. Ask your health care Nansi Birmingham for more information. WEIGHT AND DIET  Eat a healthy diet  Be sure to include plenty of vegetables, fruits, low-fat dairy products, and lean protein.  Do not eat a lot of foods high in solid fats, added sugars, or salt.  Get regular exercise. This is one of the most important things you can do for your health.  Most adults should exercise for at least 150 minutes each week. The exercise should increase your heart rate and make you sweat (moderate-intensity exercise).  Most adults should also do strengthening exercises at least twice a week. This is in addition to the moderate-intensity exercise.  Maintain a healthy weight  Body mass index (BMI) is a measurement that can be used to identify possible weight problems. It estimates body fat based on height and weight. Your health care Azari Hasler can help determine your BMI and help you achieve or maintain a healthy weight.  For females 7  years of age and older:   A BMI below 18.5 is considered underweight.  A BMI of 18.5 to 24.9 is normal.  A BMI of 25 to 29.9 is considered overweight.  A BMI of 30 and above is considered obese.  Watch levels of cholesterol and blood lipids  You should start having your blood tested for lipids and cholesterol at 45 years of age, then have this test every 5 years.  You may need to have your cholesterol levels checked more often if:  Your lipid or cholesterol levels are high.  You are older than 45 years of age.  You are at high risk for heart disease.  CANCER SCREENING   Lung Cancer  Lung cancer screening is recommended for adults 78-35 years old who are at high risk for lung cancer because of a history of smoking.  A yearly low-dose CT scan of the lungs is recommended for people who:  Currently smoke.  Have quit within the past 15 years.  Have at least a 30-pack-year history of smoking. A pack year is smoking an average of one pack of cigarettes a day for 1 year.  Yearly screening should continue until it has been 15 years since you quit.  Yearly screening should stop if you develop a health problem that would prevent you from having lung cancer treatment.  Breast Cancer  Practice breast self-awareness. This means understanding how your breasts normally appear and feel.  It also means doing regular breast self-exams. Let your health care Crecencio Kwiatek know about  any changes, no matter how small.  If you are in your 20s or 30s, you should have a clinical breast exam (CBE) by a health care Saydie Gerdts every 1-3 years as part of a regular health exam.  If you are 41 or older, have a CBE every year. Also consider having a breast X-ray (mammogram) every year.  If you have a family history of breast cancer, talk to your health care Dontavius Keim about genetic screening.  If you are at high risk for breast cancer, talk to your health care Afnan Emberton about having an MRI and a mammogram  every year.  Breast cancer gene (BRCA) assessment is recommended for women who have family members with BRCA-related cancers. BRCA-related cancers include:  Breast.  Ovarian.  Tubal.  Peritoneal cancers.  Results of the assessment will determine the need for genetic counseling and BRCA1 and BRCA2 testing. Cervical Cancer Routine pelvic examinations to screen for cervical cancer are no longer recommended for nonpregnant women who are considered low risk for cancer of the pelvic organs (ovaries, uterus, and vagina) and who do not have symptoms. A pelvic examination may be necessary if you have symptoms including those associated with pelvic infections. Ask your health care Anyela Napierkowski if a screening pelvic exam is right for you.   The Pap test is the screening test for cervical cancer for women who are considered at risk.  If you had a hysterectomy for a problem that was not cancer or a condition that could lead to cancer, then you no longer need Pap tests.  If you are older than 65 years, and you have had normal Pap tests for the past 10 years, you no longer need to have Pap tests.  If you have had past treatment for cervical cancer or a condition that could lead to cancer, you need Pap tests and screening for cancer for at least 20 years after your treatment.  If you no longer get a Pap test, assess your risk factors if they change (such as having a new sexual partner). This can affect whether you should start being screened again.  Some women have medical problems that increase their chance of getting cervical cancer. If this is the case for you, your health care Swayzie Choate may recommend more frequent screening and Pap tests.  The human papillomavirus (HPV) test is another test that may be used for cervical cancer screening. The HPV test looks for the virus that can cause cell changes in the cervix. The cells collected during the Pap test can be tested for HPV.  The HPV test can be used to  screen women 53 years of age and older. Getting tested for HPV can extend the interval between normal Pap tests from three to five years.  An HPV test also should be used to screen women of any age who have unclear Pap test results.  After 45 years of age, women should have HPV testing as often as Pap tests.  Colorectal Cancer  This type of cancer can be detected and often prevented.  Routine colorectal cancer screening usually begins at 45 years of age and continues through 45 years of age.  Your health care Jaylinn Hellenbrand may recommend screening at an earlier age if you have risk factors for colon cancer.  Your health care Juron Vorhees may also recommend using home test kits to check for hidden blood in the stool.  A small camera at the end of a tube can be used to examine your colon directly (sigmoidoscopy or  colonoscopy). This is done to check for the earliest forms of colorectal cancer.  Routine screening usually begins at age 60.  Direct examination of the colon should be repeated every 5-10 years through 45 years of age. However, you may need to be screened more often if early forms of precancerous polyps or small growths are found. Skin Cancer  Check your skin from head to toe regularly.  Tell your health care Laronn Devonshire about any new moles or changes in moles, especially if there is a change in a mole's shape or color.  Also tell your health care Tiandre Teall if you have a mole that is larger than the size of a pencil eraser.  Always use sunscreen. Apply sunscreen liberally and repeatedly throughout the day.  Protect yourself by wearing long sleeves, pants, a wide-brimmed hat, and sunglasses whenever you are outside. HEART DISEASE, DIABETES, AND HIGH BLOOD PRESSURE   Have your blood pressure checked at least every 1-2 years. High blood pressure causes heart disease and increases the risk of stroke.  If you are between 35 years and 59 years old, ask your health care Avonelle Viveros if you should  take aspirin to prevent strokes.  Have regular diabetes screenings. This involves taking a blood sample to check your fasting blood sugar level.  If you are at a normal weight and have a low risk for diabetes, have this test once every three years after 45 years of age.  If you are overweight and have a high risk for diabetes, consider being tested at a younger age or more often. PREVENTING INFECTION  Hepatitis B  If you have a higher risk for hepatitis B, you should be screened for this virus. You are considered at high risk for hepatitis B if:  You were born in a country where hepatitis B is common. Ask your health care Morio Widen which countries are considered high risk.  Your parents were born in a high-risk country, and you have not been immunized against hepatitis B (hepatitis B vaccine).  You have HIV or AIDS.  You use needles to inject street drugs.  You live with someone who has hepatitis B.  You have had sex with someone who has hepatitis B.  You get hemodialysis treatment.  You take certain medicines for conditions, including cancer, organ transplantation, and autoimmune conditions. Hepatitis C  Blood testing is recommended for:  Everyone born from [birth year] through 1966.  Anyone with known risk factors for hepatitis C. Sexually transmitted infections (STIs)  You should be screened for sexually transmitted infections (STIs) including gonorrhea and chlamydia if:  You are sexually active and are younger than 45 years of age.  You are older than 45 years of age and your health care Cai Flott tells you that you are at risk for this type of infection.  Your sexual activity has changed since you were last screened and you are at an increased risk for chlamydia or gonorrhea. Ask your health care Shakeeta Godette if you are at risk.  If you do not have HIV, but are at risk, it may be recommended that you take a prescription medicine daily to prevent HIV infection. This is called  pre-exposure prophylaxis (PrEP). You are considered at risk if:  You are sexually active and do not regularly use condoms or know the HIV status of your partner(s).  You take drugs by injection.  You are sexually active with a partner who has HIV. Talk with your health care Azriel Dancy about whether you are at high risk  of being infected with HIV. If you choose to begin PrEP, you should first be tested for HIV. You should then be tested every 3 months for as long as you are taking PrEP.  PREGNANCY   If you are premenopausal and you may become pregnant, ask your health care Shakara Tweedy about preconception counseling.  If you may become pregnant, take 400 to 800 micrograms (mcg) of folic acid every day.  If you want to prevent pregnancy, talk to your health care Constantina Laseter about birth control (contraception). OSTEOPOROSIS AND MENOPAUSE   Osteoporosis is a disease in which the bones lose minerals and strength with aging. This can result in serious bone fractures. Your risk for osteoporosis can be identified using a bone density scan.  If you are 55 years of age or older, or if you are at risk for osteoporosis and fractures, ask your health care Diamon Reddinger if you should be screened.  Ask your health care Kaveh Kissinger whether you should take a calcium or vitamin D supplement to lower your risk for osteoporosis.  Menopause may have certain physical symptoms and risks.  Hormone replacement therapy may reduce some of these symptoms and risks. Talk to your health care Ladell Bey about whether hormone replacement therapy is right for you.  HOME CARE INSTRUCTIONS   Schedule regular health, dental, and eye exams.  Stay current with your immunizations.   Do not use any tobacco products including cigarettes, chewing tobacco, or electronic cigarettes.  If you are pregnant, do not drink alcohol.  If you are breastfeeding, limit how much and how often you drink alcohol.  Limit alcohol intake to no more than 1  drink per day for nonpregnant women. One drink equals 12 ounces of beer, 5 ounces of wine, or 1 ounces of hard liquor.  Do not use street drugs.  Do not share needles.  Ask your health care Alyssamae Klinck for help if you need support or information about quitting drugs.  Tell your health care Ripken Rekowski if you often feel depressed.  Tell your health care Mayling Aber if you have ever been abused or do not feel safe at home. Document Released: 08/08/2010 Document Revised: 06/09/2013 Document Reviewed: 12/25/2012 [facility] Patient Information 2015 [place], [place]. This information is not intended to replace advice given to you by your health care Ameri Cahoon. Make sure you discuss any questions you have with your health care Rosaleah Person.

## 2014-10-30 LAB — URINALYSIS W MICROSCOPIC + REFLEX CULTURE
Bacteria, UA: NONE SEEN [HPF]
Bilirubin Urine: NEGATIVE
CASTS: NONE SEEN [LPF]
CRYSTALS: NONE SEEN [HPF]
Glucose, UA: NEGATIVE
HGB URINE DIPSTICK: NEGATIVE
KETONES UR: NEGATIVE
Leukocytes, UA: NEGATIVE
NITRITE: NEGATIVE
PH: 5.5 (ref 5.0–8.0)
Protein, ur: NEGATIVE
Specific Gravity, Urine: 1.024 (ref 1.001–1.035)
Yeast: NONE SEEN [HPF]

## 2014-10-30 LAB — FOLLICLE STIMULATING HORMONE: FSH: 4.6 m[IU]/mL

## 2014-10-30 LAB — PROLACTIN: PROLACTIN: 7.6 ng/mL

## 2014-10-31 LAB — URINE CULTURE
Colony Count: NO GROWTH
ORGANISM ID, BACTERIA: NO GROWTH

## 2014-11-20 ENCOUNTER — Ambulatory Visit: Payer: No Typology Code available for payment source | Admitting: Gynecology

## 2014-11-20 ENCOUNTER — Other Ambulatory Visit: Payer: No Typology Code available for payment source

## 2014-12-22 ENCOUNTER — Telehealth: Payer: Self-pay | Admitting: Cardiology

## 2014-12-22 NOTE — Telephone Encounter (Signed)
New Message  Pt calling to speak w/ RN concerning BP- has appt for 11/22 w/ Dayna. Please call back and discuss.   Pt c/o BP issue: STAT if pt c/o blurred vision, one-sided weakness or slurred speech  1. What are your last 5 BP readings? 180/99, 155*99, yesterday (11/14) 155/120  2. Are you having any other symptoms (ex. Dizziness, headache, blurred vision, passed out)? Fatigue   3. What is your BP issue? Elevated BP

## 2014-12-22 NOTE — Telephone Encounter (Signed)
Notified the pt to inform her that I went to speak with our DOD Flex Tereso NewcomerScott Weaver PA-C, and he reviewed her complaints and current BP readings, and he advised that we work the pt in on the flex schedule today or tomorrow.  Informed the pt that Lorin PicketScott felt that its safest that we see the pt, before initiating any new medications at this time.  Informed the pt that we have an availability open for tomorrow on our Flex schedule, for 1000 with Nada BoozerLaura Ingold NP.  Pt verbalized understanding and agrees with this plan.  Pt gracious for all the assistance provided.

## 2014-12-22 NOTE — Telephone Encounter (Signed)
Pt calling in to report that she went to see her OBGYN back in September, and her MD noted that her BP was elevated at 188/98.  Per the pt, her OBGYN advised her to keep an eye out on this and start monitoring her BP at home daily. Pt states she has never had any hypertension issues or any cardiac issues, except for benign PVCs back in 2015.  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 Pt states that she does have a strong family history of both parents, and her brother developing HTN with age.  Pt states that she exercises and eats healthy on a daily basis.  Pt states she avoids all salt intake.  Pt states that she has no complaints of chest pain, sob, blurred vision, headache, pre-syncopal, or syncopal issues during her episodes of HTN.  Pt states the only thing she notices when her BP is continuously elevated, is she will fatigue out quicker while exercising.  Pt only takes multivitamins and herbs.  Pt states the last 3 readings of her BP recorded over the course of 3 days was 165/99, 180/90, and she states she went to give blood yesterday and they reported her BP was 155/120.  Pt has no recorded HR values.  Pt states she is aware she has an appt next week with Ronie Spiesayna Dunn PA-C on 11/22, but she feels like this issue should be addressed before then.  Informed the pt that I will go and speak with our DOD Flex for further review and recommendation of this complaint, and follow-up with the pt thereafter.  Pt verbalized understanding and agrees with this plan.

## 2014-12-23 ENCOUNTER — Encounter: Payer: Self-pay | Admitting: Cardiology

## 2014-12-23 ENCOUNTER — Ambulatory Visit (INDEPENDENT_AMBULATORY_CARE_PROVIDER_SITE_OTHER): Payer: PRIVATE HEALTH INSURANCE | Admitting: Cardiology

## 2014-12-23 VITALS — BP 138/90 | HR 76 | Ht 67.0 in | Wt 162.0 lb

## 2014-12-23 DIAGNOSIS — I10 Essential (primary) hypertension: Secondary | ICD-10-CM

## 2014-12-23 MED ORDER — VALSARTAN 160 MG PO TABS: 160.0000 mg | ORAL_TABLET | Freq: Every day | ORAL | Status: DC

## 2014-12-23 NOTE — Progress Notes (Signed)
Cardiology Office Note   Date:  12/23/2014   ID:  Emma Barajas, DOB 1970-07-06, MRN [id number]  PCP:  [name], MD  Cardiologist:  Dr. Delton See    Chief Complaint  Patient presents with  . Hypertension    over 1 month elevated BP      History of Present Illness: Emma Barajas is a 44 y.o. female who presents for HTN.  Previously seen for PVCs and wore event monitor -revealing PVCs.  Also echo which was normal.  Now with elevated BP 188/98 and other reading of 165/99, 180/90, and even 155/120.  BP elevation first noted with obGYN- labs done that were normal.  Since then BP continues to be elevated.  At times with activity she has felt she must take an extra breath or so.  But no chest pain.  She is training for a 50 mile walk but is only in early stages.  She is active and eats healthy.  Rare caffeine and no OTC meds. + family hx HTN.     Past Medical History  Diagnosis Date  . IUD 02-23-10    ORIGINALLY INSERTED 01/09/05 AND REPLACE ON 02-23-10  . ADD (attention deficit disorder)   . 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 Cervical biopsy  w/ loop electrode excision  2002    HGSIL  . Wisdom tooth extraction    . Lasix eye surgery  11/2010  . Intrauterine device insertion  02/2010  . Mouth surgery       Current Outpatient Prescriptions  Medication Sig Dispense Refill  . Calcium Carbonate-Vitamin D (CALCIUM + D PO) Take by mouth.    . levonorgestrel (MIRENA) 20 MCG/24HR IUD 1 each by Intrauterine route once. INSERTED JAN 2012     . Multiple Vitamin (MULTIVITAMIN) tablet Take 1 tablet by mouth daily.    . Omega-3 Fatty Acids (FISH OIL PO) Take by mouth.      Marland Kitchen OVER THE COUNTER MEDICATION reservatol      . valACYclovir (VALTREX) 1000 MG tablet     . valsartan (DIOVAN) 160 MG tablet Take 1 tablet (160 mg total) by mouth daily. 90 tablet 0   No current facility-administered  medications for this visit.    Allergies:   Other    Social History:  The patient  reports that she has never smoked. She has never used smokeless tobacco. She reports that she drinks about 9.0 oz of alcohol per week. She reports that she does not use illicit drugs.   Family History:  The patient's family history includes Cancer in her father; Diabetes in her father; Heart attack in her maternal grandmother; Hyperlipidemia in her father and mother; Hypertension in her father.    ROS:  General:no colds or fevers, no weight changes Skin:no rashes or ulcers HEENT:no blurred vision, no congestion CV:see HPI PUL:see HPI GI:no diarrhea constipation or melena, no indigestion GU:no hematuria, no dysuria MS:no joint pain, no claudication Neuro:no syncope, no lightheadedness Endo:no diabetes, no thyroid disease   Wt Readings from Last 3 Encounters:  12/23/14 162 lb (73.483 kg)  10/29/14 159 lb 9.6 oz (72.394 kg)  03/27/13 158 lb (71.668 kg)     PHYSICAL EXAM: VS:  BP 138/90 mmHg  Pulse 76  Ht  (1.702 m)  Wt 162 lb (73.483 kg)  BMI 25.37 kg/m2  LMP 12/08/2014 (Within Weeks) , BMI Body mass index is 25.37 kg/(m^2).  General:Pleasant affect, NAD Skin:Warm and dry, brisk capillary refill HEENT:normocephalic, sclera clear, mucus membranes moist Neck:supple, no JVD, no bruits  Heart:S1S2 RRR without murmur, gallup, rub or click Lungs:clear without rales, rhonchi, or wheezes NGE:XBMWAbd:soft, non tender, + BS, do not palpate liver spleen or masses Ext:no lower ext edema, 2+ pedal pulses, 2+ radial pulses Neuro:alert and oriented, MAE, follows commands, + facial symmetry    EKG:  EKG is ordered today. The ekg ordered today demonstrates SR no PVCs normal EKG.     Recent Labs: 10/29/2014: ALT 8; BUN 12; Creat 0.60; Hemoglobin 13.1; Platelets 202; Potassium 4.9; Sodium 141; TSH 1.398    Lipid Panel    Component Value Date/Time   CHOL 188 10/29/2014 0921   TRIG 89 10/29/2014 0921    HDL 71 10/29/2014 0921   CHOLHDL 2.6 10/29/2014 0921   VLDL 18 10/29/2014 0921   LDLCALC 99 10/29/2014 0921       Other studies Reviewed: Additional studies/ records that were reviewed today include: Echo.   ASSESSMENT AND PLAN:  1.  HTN add diovan 160 mg daily.  May need BMP next week when she follows with Ms. Shea Evansunn, PAc  Thought about BB but no current PVCs and she is so active would fear that her energy level would decrease with BB.   Current medicines are reviewed with the patient today.  The patient Has no concerns regarding medicines.  The following changes have been made:  See above Labs/ tests ordered today include:see above  Disposition:   FU:  see above  Signed, Leone BrandINGOLD,Ai Sonnenfeld R, NP  12/23/2014 1:10 PM    [facility] [address], [place], [place]  [address]/ [address] [place], [place] Phone: [phone]; Fax: [phone]  [phone]

## 2014-12-23 NOTE — Patient Instructions (Signed)
Medication Instructions:  Your physician has recommended you make the following change in your medication:  1.  START the Diovan 160 mg tablet taking 1 tablet a day.   Labwork: NONE ORDERED  Testing/Procedures: NONE ORDERED  Follow-Up: Your physician recommends that you keep your scheduled follow-up appointment with Ronie Spiesayna Dunn, PA-C on 12-29-14 @ 8:00 so we can monitor what your pressure is doing.   Any Other Special Instructions Will Be Listed Below (If Applicable).  PLEASE CHECK YOUR BLOOD PRESSURE AT LEAST 1 TIME A DAY AND KEEP A LOG OF IT AND BRING IT WITH YOU AT THE F/U APPOINTMENT.   If you need a refill on your cardiac medications before your next appointment, please call your pharmacy.

## 2014-12-28 ENCOUNTER — Encounter: Payer: Self-pay | Admitting: Physician Assistant

## 2014-12-28 NOTE — Progress Notes (Signed)
Cardiology Office Note Date:  12/29/2014  Patient ID:  Emma Barajas, DOB 01/29/1971, MRN [id number] PCP:  Dara LordsFONTAINE,TIMOTHY P, MD  Cardiologist: Dr. Delton SeeNelson   Chief Complaint: f/u HTN  History of Present Illness: Emma Barajas is a 44 y.o. female with history of HTN and PVCs who presents for f/u BP. To recap hx, event monitor 04/2013 showed frequent PVCs - total 8160 (4.8% of the time). F/u 2D echo 06/2013: EF 60-65%, grade 2 DD. Labs 10/2014 showed normal CBC, CMET, TSH, LDL 99. She saw Nada BoozerLaura Ingold 12/23/14 for BP at which time Diovan was added. BB was considered but she was not having current PVCs and there was fear that her energy level would decrease with BB (training for 50mile walk).  She comes in today for follow-up. She brings in a BP log of twice-daily blood pressures. Majority of her AM BPs are well controlled when she first wakes up - generally 103-110 systolic/70s diastolic. It is around this time that she takes her Diovan. In the evenings (5-6pm+) she tends to have consistently elevated BPs in the 150s-170s/90s-110 range. She noticed an elevated HR one day up to 118 while washing her car - it just felt like a higher version of her normal heart rate, no irregular palpitations or other symptoms. She does consume wine regularly. It is not every day, but she does occasionally split a bottle of wine with her husband or go out with friends. Denies excess salt intake or snoring. No significant cardiac sx otherwise reported.   Past Medical History  Diagnosis Date  . IUD 02-23-10    ORIGINALLY INSERTED 01/09/05 AND REPLACE ON 02-23-10  . ADD (attention deficit disorder)   . HGSIL (high grade squamous intraepithelial dysplasia) 2002    LEEP  . Essential hypertension   . PVC's (premature ventricular contractions)     a. Event monitor 04/2013 showed frequent PVCs - total 8160 (4.8% of the time). F/u 2D echo 06/2013: EF 60-65%, grade 2 DD.    Past Surgical History  Procedure Laterality Date    . Cervical biopsy  w/ loop electrode excision  2002    HGSIL  . Wisdom tooth extraction    . Lasix eye surgery  11/2010  . Intrauterine device insertion  02/2010  . Mouth surgery      Current Outpatient Prescriptions  Medication Sig Dispense Refill  . Calcium Carbonate-Vitamin D (CALCIUM + D PO) Take 1 tablet by mouth daily.     Marland Kitchen. levonorgestrel (MIRENA) 20 MCG/24HR IUD 1 each by Intrauterine route once. INSERTED JAN 2012     . Multiple Vitamin (MULTIVITAMIN) tablet Take 1 tablet by mouth daily.    . Omega-3 Fatty Acids (FISH OIL PO) Take 1 capsule by mouth daily.     . valACYclovir (VALTREX) 1000 MG tablet Take 1,000 mg by mouth as needed (cold sores).     . valsartan (DIOVAN) 160 MG tablet Take 1 tablet (160 mg total) by mouth daily. 90 tablet 0   No current facility-administered medications for this visit.    Allergies:   Other   Social History:  The patient  reports that she has never smoked. She has never used smokeless tobacco. She reports that she drinks about 9.0 oz of alcohol per week. She reports that she does not use illicit drugs.   Family History:  The patient's family history includes Cancer in her father; Diabetes in her father; Heart attack in her maternal grandmother; Hyperlipidemia in her father and mother; Hypertension in  her father.  ROS:  Please see the history of present illness.  All other systems are reviewed and otherwise negative.   PHYSICAL EXAM:  VS:  BP 128/90 mmHg  Pulse 66  Ht  (1.702 m)  Wt 161 lb 1.9 oz (73.084 kg)  BMI 25.23 kg/m2  SpO2 99%  LMP 12/08/2014 (Within Weeks) BMI: Body mass index is 25.23 kg/(m^2). Well nourished, well developed WF, in no acute distress HEENT: normocephalic, atraumatic Neck: no JVD, carotid bruits or masses Cardiac:  normal S1, S2; RRR; no murmurs, rubs, or gallops Lungs:  clear to auscultation bilaterally, no wheezing, rhonchi or rales Abd: soft, nontender, no hepatomegaly, + BS MS: no deformity or  atrophy Ext: no edema Skin: warm and dry, no rash Neuro:  moves all extremities spontaneously, no focal abnormalities noted, follows commands Psych: euthymic mood, full affect   Recent Labs: 10/29/2014: ALT 8; BUN 12; Creat 0.60; Hemoglobin 13.1; Platelets 202; Potassium 4.9; Sodium 141; TSH 1.398  10/29/2014: Cholesterol 188; HDL 71; LDL Cholesterol 99; Total CHOL/HDL Ratio 2.6; Triglycerides 89; VLDL 18   CrCl cannot be calculated (Patient has no serum creatinine result on file.).   Wt Readings from Last 3 Encounters:  12/29/14 161 lb 1.9 oz (73.084 kg)  12/23/14 162 lb (73.483 kg)  10/29/14 159 lb 9.6 oz (72.394 kg)     Other studies reviewed: Additional studies/records reviewed today include: summarized above  ASSESSMENT AND PLAN:  1. Essential HTN - AM BPs are controlled but PM BPs remain elevated. Will add a 1/2 tablet of Diovan in the afternoon (  QAM/80mg  QPM). She will continue to follow her BP at home. If it remains >140 systolic or >90 diastolic in the evenings after several days, I asked her to further increase Diovan to  BID. I told her if remains elevated despite this change to give [place] a call. Check BMET today given recent ARB initiation. We also discussed the fact that regular alcohol consumption can drive up blood pressure and that cutting down is advised. 2. PVCs - quiescent.  3. Reported tachycardia - She did report an episode of elevated HR the other day while washing her car. Not sure if this was just related to exertion. She denies irregularity or abrupt onset. Will continue to observe for now. If she has recurrent elevated HR, I told her to call our office at which time we would consider placing another event monitor.  Disposition: F/u with Dr. Delton See in 4-6 months.  Current medicines are reviewed at length with the patient today.  The patient did not have any concerns regarding medicines.  Thomasene Mohair PA-C 12/29/2014 8:18 AM     [facility] [address] [place] [address] [phone] (office)  [phone] (fax)

## 2014-12-29 ENCOUNTER — Ambulatory Visit (INDEPENDENT_AMBULATORY_CARE_PROVIDER_SITE_OTHER): Payer: PRIVATE HEALTH INSURANCE | Admitting: Physician Assistant

## 2014-12-29 ENCOUNTER — Encounter: Payer: Self-pay | Admitting: Physician Assistant

## 2014-12-29 VITALS — BP 128/90 | HR 66 | Ht 67.0 in | Wt 161.1 lb

## 2014-12-29 DIAGNOSIS — I493 Ventricular premature depolarization: Secondary | ICD-10-CM | POA: Diagnosis not present

## 2014-12-29 DIAGNOSIS — I10 Essential (primary) hypertension: Secondary | ICD-10-CM

## 2014-12-29 DIAGNOSIS — R000 Tachycardia, unspecified: Secondary | ICD-10-CM

## 2014-12-29 LAB — BASIC METABOLIC PANEL
BUN: 16 mg/dL (ref 7–25)
CALCIUM: 9.4 mg/dL (ref 8.6–10.2)
CO2: 26 mmol/L (ref 20–31)
Chloride: 99 mmol/L (ref 98–110)
Creat: 0.6 mg/dL (ref 0.50–1.10)
Glucose, Bld: 80 mg/dL (ref 65–99)
POTASSIUM: 4.4 mmol/L (ref 3.5–5.3)
SODIUM: 135 mmol/L (ref 135–146)

## 2014-12-29 MED ORDER — VALSARTAN 160 MG PO TABS: ORAL_TABLET | ORAL | Status: DC

## 2014-12-29 NOTE — Patient Instructions (Addendum)
Medication Instructions:  Your physician has recommended you make the following change in your medication:  1.  INCREASE the Diovan to 160 mg taking 1 tablet in the a.m and 1/2 tablet in the p.m.   Labwork: TODAY:  BMET   Testing/Procedures: None ordered  Follow-Up: Your physician wants you to follow-up in: 4-6 MONTHS WITH DR. Delton SeeNELSON.  You will receive a reminder letter in the mail two months in advance. If you don't receive a letter, please call our office to schedule the follow-up appointment.   Any Other Special Instructions Will Be Listed Below (If Applicable). 1.  Keep checking your blood pressure.  If it remains >140/90 in the pm after a couple of days, then INCREASE the Diovan to 160 mg taking 1      tablet twice a day 2.  If your blood pressure remains >140/90 CALL OUR OFFICE [phone].    If you need a refill on your cardiac medications before your next appointment, please call your pharmacy.

## 2015-01-04 ENCOUNTER — Telehealth: Payer: Self-pay | Admitting: Cardiology

## 2015-01-04 MED ORDER — HYDROCHLOROTHIAZIDE 25 MG PO TABS: 25.0000 mg | ORAL_TABLET | Freq: Every day | ORAL | Status: DC

## 2015-01-04 NOTE — Telephone Encounter (Signed)
New message      Pt c/o BP issue: STAT if pt c/o blurred vision, one-sided weakness or slurred speech  1. What are your last 5 BP readings? Nov 22 132/95, 133/93,  Nov 23 139/96, 142/91; nov 24 126/81, 142/91; nov 25 120/84; nov 26 111/80, 149/98, 161/101; nov 27 152/101, 133/88, 138/93; nov 28 149/97 2. Are you having any other symptoms (ex. Dizziness, headache, blurred vision, passed out)? Sat--felt aweful  3. What is your BP issue?  bp still high.  Pt will be going on vacation to [place] for a week soon.  She want her bp to be lower if possible

## 2015-01-04 NOTE — Telephone Encounter (Signed)
Left message to call back  

## 2015-01-04 NOTE — Telephone Encounter (Signed)
Please increase to 160 mg po BID, thank you

## 2015-01-04 NOTE — Telephone Encounter (Signed)
Please add hydrochlorothiazide 25 mg po daily.

## 2015-01-04 NOTE — Telephone Encounter (Signed)
Pt took Diovan 160mg  BID Saturday and Sunday and states that BP has been steadily getting higher. Wanted to clarify with you if you would like for her to continue at this dose and monitor or if you would like to make a change?

## 2015-01-04 NOTE — Telephone Encounter (Signed)
Spoke with pt and she states that BP is still running high even on the increased dose of Diovan. (List of BPs in original message) Ronie Spiesayna Dunn, PA-C increased Diovan 160mg  to one tab in the morning and 1/2 tab in the evening. Pt states that she did increase this and it wasn't working so on Saturday the 26th pt started taking Diovan 160mg  BID. Pt states that BP has actually been a little higher since increasing but her HR has been in the 80's for the last week. Pt denies CP, SOB, headache, blurred vision, lightheadedness or dizziness. Pt does state that she feels fatigued. Pt states that she is leaving to go to [place] on vacation on December 12th and would like to try to get this under control prior to leaving. Advised pt that I would route this information to Dr. Delton SeeNelson for review and advisement.

## 2015-01-04 NOTE — Telephone Encounter (Signed)
Spoke with pt and informed her of new order for HCTZ 25mg  QD. Verified pharmacy. Pt verbalized understanding and was in agreement with this plan.

## 2015-01-07 ENCOUNTER — Telehealth: Payer: Self-pay | Admitting: Cardiology

## 2015-01-07 NOTE — Telephone Encounter (Signed)
Informed the pt that per Delton SeeNelson, 90 is a little low, so when its this low, she should cut her HCTZ in half or not take it on days when her BP is too low.  Pt verbalized understanding and agrees with this plan.

## 2015-01-07 NOTE — Telephone Encounter (Signed)
Pt c/o BP issue: STAT if pt c/o blurred vision, one-sided weakness or slurred speech  1. What are your last 5 BP readings?  90/70 this morning 6am 105/74 @ 8am  2. Are you having any other symptoms (ex. Dizziness, headache, blurred vision, passed out)? no  3. What is your BP issue? BP is low   She think it may be coming from her taking hydrochlorophiazide 25mg . Please advise

## 2015-01-07 NOTE — Telephone Encounter (Signed)
Yes, 90 is a little low, she can cut HCTZ in half or not take it on days when her BP is too low.

## 2015-01-07 NOTE — Telephone Encounter (Signed)
Pt calling to inform Dr Delton SeeNelson that since she started taking HCTZ on 11/29, in addition to taking Diovan 160 mg 1 tablet in the morning and 1/2 tablet in the p.m., her BP is now going down to a low normal, but she wants to make sure that her readings aren't too low.  Pt reports that her BP yesterday at 0900 was 113/85 HR-97 and in the afternoon was 111/79.   Pt reports she has been taking her meds exactly as instructed.  Pt reports when waking up this morning her BP was 90/70 at 0600 and then at 8 am was 105/74.   Pt reports she has taken her Diovan this morning but held her HCTZ 25 mg because she was unsure if her BP was to low to withhold the HCTZ.  Pt states she is completely asymptomatic with these readings, and actually states "I feel better since my BP is going down, especially in my chest." Pt states she just wanted clarification from Dr Delton SeeNelson that its safe to resume taking her HCTZ in the mornings, with her numbers being in the low normal range.  Pt also wanted Dr Delton SeeNelson to advise if maybe she should lower her dose of diovan to just once a day, or keep it the same.  Informed the pt that being she is asymptomatic with her readings, she should be safe to take her meds just as is.  Advised the pt that if it makes her feel better, she could hold her HCTZ this morning, take her BP at around noon, and if this permits so, then she should resume back taking her HCTZ for the day.  Informed the pt that Dr Delton SeeNelson is currently out of the office today, but I will still forward this message to her for further review and recommendation and follow-up with her thereafter.  Pt verbalized understanding and agrees with this plan.

## 2015-01-08 ENCOUNTER — Telehealth: Payer: Self-pay | Admitting: *Deleted

## 2015-01-08 NOTE — Telephone Encounter (Signed)
Patient left a voicemail on the refill line requesting an rx for valsartan be faxed to [email or web address]. The fax number provided by the patient was [phone]. Please advise. Thanks, MI

## 2015-01-12 MED ORDER — VALSARTAN 160 MG PO TABS: 160.0000 mg | ORAL_TABLET | Freq: Two times a day (BID) | ORAL | Status: DC

## 2015-01-12 NOTE — Telephone Encounter (Signed)
Pt calling to say that she wants to go back to her original order of Valsartan that Dr Delton SeeNelson ordered on 01/04/15.  Per Dr Delton SeeNelson on 11/28 telephone encounter that the pt should take Valsartan 160 mg po BID.  Pt states that this was to be changed by the nurse who took the call on 11/28, when Dr Delton SeeNelson gave this verbal order.  Pt request that this now be sent to her current pharmacy of choice, [facility].  Pt states that she will then have [facility] fax this script too [email or web address], for its cheaper for the pt to use their services.  Informed the pt that I will change her diovan in the system and send this to her pharmacy of choice.  Pt verbalized understanding and agrees with this plan.  Please see telephone note on 01/04/15 for written order to increase this med per Dr Delton SeeNelson.

## 2015-03-03 ENCOUNTER — Telehealth: Payer: Self-pay | Admitting: Cardiology

## 2015-03-03 DIAGNOSIS — R0789 Other chest pain: Secondary | ICD-10-CM

## 2015-03-03 DIAGNOSIS — I493 Ventricular premature depolarization: Secondary | ICD-10-CM

## 2015-03-03 NOTE — Telephone Encounter (Signed)
New Message  Pt requested to speak w/ RN concerning recent infrequent chest tightness- pt was sched appt w/ Dayna 1/31. Please call back and discuss.

## 2015-03-03 NOTE — Telephone Encounter (Signed)
I would keep the appointment with Dayna on Tuesday. Please try to schedule an exercise treadmill stress test prior to that, if not possible, it can be after the visit.

## 2015-03-03 NOTE — Telephone Encounter (Signed)
Pt calling in to let Dr Delton See know that over the course of the past 3 days, she has been feeling on and off chest tightness.  Pt states this is not chest pain, just tightness.  Pt states that it almost feels like the tightness you get in your pectoral muscles, after you've  done a good work out.  Pt states she has very mild sob, but this is intermittent in nature.  Pt reports she feels no palpitations, fluttering, dizziness, DOE, pre-syncopal or syncopal episodes.  Pt states she has cut her caffeine intake back drastically since her symptoms started.  Pt states her V/S are stable and have remained at 116/70s and HR- 70-98 over the course of her chest tightness.   Pt states the only cardiac hx she really has, is PVCs  Pt states when she had PVCs in the past, she never really felt anything.  Pt states she did make an appt with Ronie Spies for next Tuesday 03/10/15 for further follow-up.  Pt also states that she lost her cousin over the weekend, and noted her symptoms came on around that time.  Pt states this was a very devastating and unexpected loss in her family.  Pt states she is not at all acute enough to seek immediate medical attention, or even go to Urgent Care right now.  Pt simply wanted to run this information by Dr Delton See, to see if she recommends anything else, or should she just continue to monitor as she's been doing, and see Dayna as planned next Tuesday.  Provided pt education on when she should be considered with an acute cardiac event,  and needs to seek immediate medical attention.  Informed the pt that I will route this message to Dr Delton See for further review and recommendation and follow-up with her thereafter.  Pt verbalized understanding and agrees with this plan.

## 2015-03-03 NOTE — Telephone Encounter (Signed)
Notified the pt that per Dr Delton See, she reviewed the pts complaints from earlier today and recommends that she keep her appt with Dayna for 03/10/15, and she wants to order for her to have a GXT prior to her 03/10/15 appt with Dayna, if possible, but if not then she needs to have it thereafter that appt.  Informed the pt that I will place the order in the system and have our [facility] schedulers call her back to ideally schedule this appt prior to her 03/10/15 appt if possible, or thereafter.  Went over stress test instructions over the phone with the pt.  Pt verbalized understanding and agrees with this plan.

## 2015-03-03 NOTE — Telephone Encounter (Signed)
Left a message for the pt to call back.  

## 2015-03-04 NOTE — Telephone Encounter (Signed)
Pt is scheduled to have her GXT done on 03/09/15 at 2:15pm with Ronie Spies PA-C, then she is to have a OV with Dayna thereafter at 2:30 pm.  Pt made aware of both appts by [facility] scheduling, and agreed to appt dates and times.

## 2015-03-09 ENCOUNTER — Encounter: Payer: Self-pay | Admitting: Physician Assistant

## 2015-03-09 ENCOUNTER — Ambulatory Visit (INDEPENDENT_AMBULATORY_CARE_PROVIDER_SITE_OTHER): Payer: PRIVATE HEALTH INSURANCE | Admitting: Physician Assistant

## 2015-03-09 ENCOUNTER — Encounter: Payer: PRIVATE HEALTH INSURANCE | Admitting: Physician Assistant

## 2015-03-09 ENCOUNTER — Ambulatory Visit (INDEPENDENT_AMBULATORY_CARE_PROVIDER_SITE_OTHER): Payer: PRIVATE HEALTH INSURANCE

## 2015-03-09 VITALS — BP 135/96 | HR 79 | Resp 14 | Ht 67.0 in

## 2015-03-09 DIAGNOSIS — I493 Ventricular premature depolarization: Secondary | ICD-10-CM

## 2015-03-09 DIAGNOSIS — R0789 Other chest pain: Secondary | ICD-10-CM

## 2015-03-09 DIAGNOSIS — I10 Essential (primary) hypertension: Secondary | ICD-10-CM

## 2015-03-09 LAB — EXERCISE TOLERANCE TEST
Estimated workload: 15.1 METS
Exercise duration (min): 13 min
Exercise duration (sec): 0 s
MPHR: 175 {beats}/min
Peak HR: 179 {beats}/min
Percent HR: 102 %
RPE: 17
Rest HR: 72 {beats}/min

## 2015-03-09 NOTE — Progress Notes (Signed)
Cardiology Office Note Date:  03/09/2015  Patient ID:  Emma Barajas, DOB Jun 28, 1970, MRN [id number] PCP:  [name], MD  Cardiologist: Delton See  Chief Complaint: chest pain  History of Present Illness: Emma Barajas is a 45 y.o. female with history of HTN and PVCs who presents for evaluation of chest pain.  To recap hx, event monitor 04/2013 showed frequent PVCs - total 8160 (4.8% of the time). F/u 2D echo 06/2013: EF 60-65%, grade 2 DD. Labs 10/2014 showed normal CBC, CMET, TSH, LDL 99. She saw Nada Boozer NP 12/23/14 for BP at which time Diovan was added. BB was considered but she was not having current PVCs and there was fear that her energy level would decrease with BB (training for walk). At f/u on 12/29/15 we added additional dose of Diovan in the PM. She eventually required addition of HCTZ per review of phone notes. However, as she has continued to train for her walk (scheduled in March in [place]), she has required less medication.   She discontinued her HCTZ and had to reduce the Diovan back to once a day due to low BPs. She brings a log of her blood pressures today - both AM and PM levels are normotensive. (She does have occasional elevated PM diastolic values but average trend is in the 1teens/70s-80s). She drinks alcohol nightly and says she's been trying to cut back to 1 glass per night. Over the past several weeks she's had episodic chest pain without particular trigger. When it comes on, it tends to last all day. It gets worse when she twists her upper body. It feels like a tightness in her upper chest and throat and is associated with the sensation of the need to take a breath. It is not associated with exertion. She walked 18 miles with her training group on Sunday 03/07/15 without any exertional limitation. There are no alleviating factors.  She presented for ETT today as recommended by Dr. Delton See and exercised for 13 minutes without any chest pain. ETT was  electrically negative for ischemia. She did have occasional PVCs in exercise, as well as brief quadrigeimny in recovery, both of which she was asymptomatic with.   Past Medical History  Diagnosis Date  . IUD 02-23-10    ORIGINALLY INSERTED 01/09/05 AND REPLACE ON 02-23-10  . ADD (attention deficit disorder)   . HGSIL (high grade squamous intraepithelial dysplasia) 2002    LEEP  . Essential hypertension   . PVC's (premature ventricular contractions)     a. Event monitor 04/2013 showed frequent PVCs - total 8160 (4.8% of the time). F/u 2D echo 06/2013: EF 60-65%, grade 2 DD.    Past Surgical History  Procedure Laterality Date  . Cervical biopsy  w/ loop electrode excision  2002    HGSIL  . Wisdom tooth extraction    . Lasix eye surgery  11/2010  . Intrauterine device insertion  02/2010  . Mouth surgery      Current Outpatient Prescriptions  Medication Sig Dispense Refill  . Calcium Carbonate-Vitamin D (CALCIUM + D PO) Take 1 tablet by mouth daily.     Marland Kitchen levonorgestrel (MIRENA) 20 MCG/24HR IUD 1 each by Intrauterine route once. INSERTED JAN 2012     . Multiple Vitamin (MULTIVITAMIN) tablet Take 1 tablet by mouth daily.    . Omega-3 Fatty Acids (FISH OIL PO) Take 1 capsule by mouth daily.     . valACYclovir (VALTREX) 1000 MG tablet Take 1,000 mg by mouth as needed (cold  sores).     . valsartan (DIOVAN) 160 MG tablet Take 160 mg by mouth daily.     No current facility-administered medications for this visit.    Allergies:   Other   Social History:  The patient  reports that she has never smoked. She has never used smokeless tobacco. She reports that she drinks about 9.0 oz of alcohol per week. She reports that she does not use illicit drugs.   Family History:  The patient's family history includes Cancer in her father; Diabetes in her father; Heart attack in her maternal grandmother; Hyperlipidemia in her father and mother; Hypertension in her father.  ROS:  Please see the history  of present illness. No evidence of GI bleeding. All other systems are reviewed and otherwise negative.   PHYSICAL EXAM: VS:  BP 135/96 mmHg  Pulse 79  Resp 14  Ht  (1.702 m)  Well nourished, well developed fit appearing WF, in no acute distress HEENT: normocephalic, atraumatic Neck: no JVD or masses Cardiac:  normal S1, S2; RRR; no murmurs, rubs, or gallops Lungs:  clear to auscultation bilaterally, no wheezing, rhonchi or rales Abd: soft, nontender, no hepatomegaly, + BS MS: no deformity or atrophy Ext: no edema Skin: warm and dry, no rash Neuro:  moves all extremities spontaneously, no focal abnormalities noted, follows commands Psych: euthymic mood, full affect  EKG:  Done today shows NSR without acute changes  Recent Labs: 10/29/2014: ALT 8; Hemoglobin 13.1; Platelets 202; TSH 1.398 12/29/2014: BUN 16; Creat 0.60; Potassium 4.4; Sodium 135  10/29/2014: Cholesterol 188; HDL 71; LDL Cholesterol 99; Total CHOL/HDL Ratio 2.6; Triglycerides 89; VLDL 18   CrCl cannot be calculated (Unknown ideal weight.).   Wt Readings from Last 3 Encounters:  12/29/14 161 lb 1.9 oz (73.084 kg)  12/23/14 162 lb (73.483 kg)  10/29/14 159 lb 9.6 oz (72.394 kg)     Other studies reviewed: Additional studies/records reviewed today include: summarized above  ASSESSMENT AND PLAN:  1. Chest pain - atypical. She exercised for 13 minutes without angina - ETT negative. Differential includes musculoskeletal pain versus gastritis related to habitual wine intake. I offered her GI evaluation versus empiric OTC therapy. At this time she has chosen the latter. We discussed options of trying omeprazole  BID versus Zantac  BID depending on cost preferences - either one would be reasonable. If symptoms do not improve, would refer to GI to consider endoscopy. She has not had any symptoms of GI bleeding. 2. Essential HTN - continue current regimen. BP was slightly elevated prior to stress test but the  log she brings in show satisfactory blood pressures.  3. H/o PVCs - briefly seen during ETT as above, but asymptomatic. Continue surveillance.  Disposition: F/u with Dr. Delton See in several months as previously scheduled.  Current medicines are reviewed at length with the patient today.  The patient did not have any concerns regarding medicines.  Thomasene Mohair PA-C 03/09/2015 3:05 PM     [facility] [address] [place] [address] [phone] (office)  [phone] (fax)

## 2015-03-09 NOTE — Patient Instructions (Addendum)
Medication Instructions:  Your physician recommends that you continue on your current medications as directed. Please refer to the Current Medication list given to you today.   Labwork: None ordered  Testing/Procedures: None ordedered  Follow-Up: Your physician wants you keep your follow-up appointment WITH DR. Delton See AS SCHEDULED.      Any Other Special Instructions Will Be Listed Below (If Applicable).  You can try Omeprazole 20 mg taking twice a day or Zantac 150 mg taking twice a day   If you need a refill on your cardiac medications before your next appointment, please call your pharmacy.

## 2015-04-30 ENCOUNTER — Telehealth: Payer: Self-pay | Admitting: Cardiology

## 2015-04-30 NOTE — Telephone Encounter (Signed)
Follow Up   Pt called req a call back to discuss of the appt on 05/05/15 is still needed

## 2015-04-30 NOTE — Telephone Encounter (Signed)
LMTCB

## 2015-05-03 NOTE — Telephone Encounter (Signed)
F/u  Pt returning RN phone call. Please call back and discuss.   

## 2015-05-03 NOTE — Telephone Encounter (Signed)
Called patient back about her appointment. Encourage patient to keep appointment with Dr. Delton SeeNelson to see what her plan of care will be from this time forward. Patient verbalized understanding.

## 2015-05-04 ENCOUNTER — Other Ambulatory Visit: Payer: Self-pay | Admitting: Gynecology

## 2015-05-04 DIAGNOSIS — Z30431 Encounter for routine checking of intrauterine contraceptive device: Secondary | ICD-10-CM

## 2015-05-05 ENCOUNTER — Ambulatory Visit (INDEPENDENT_AMBULATORY_CARE_PROVIDER_SITE_OTHER): Payer: PRIVATE HEALTH INSURANCE | Admitting: Cardiology

## 2015-05-05 ENCOUNTER — Ambulatory Visit: Payer: No Typology Code available for payment source | Admitting: Gynecology

## 2015-05-05 ENCOUNTER — Encounter: Payer: Self-pay | Admitting: Cardiology

## 2015-05-05 VITALS — BP 118/74 | HR 76 | Ht 67.0 in | Wt 164.0 lb

## 2015-05-05 DIAGNOSIS — R079 Chest pain, unspecified: Secondary | ICD-10-CM | POA: Diagnosis not present

## 2015-05-05 DIAGNOSIS — I519 Heart disease, unspecified: Secondary | ICD-10-CM

## 2015-05-05 DIAGNOSIS — I5189 Other ill-defined heart diseases: Secondary | ICD-10-CM

## 2015-05-05 DIAGNOSIS — I10 Essential (primary) hypertension: Secondary | ICD-10-CM | POA: Diagnosis not present

## 2015-05-05 DIAGNOSIS — I493 Ventricular premature depolarization: Secondary | ICD-10-CM

## 2015-05-05 NOTE — Progress Notes (Signed)
Patient ID: Emma Barajas, female   DOB: [birth year], 45 y.o.   MRN: [id number]      Cardiology Office Note Date:  05/05/2015  Patient ID:  Emma Barajas, DOB 1970-09-12, MRN [id number] PCP:  [name], MD  Cardiologist: Delton See  Chief Complaint: chest pain, hypertension  History of Present Illness: Emma Barajas is a 45 y.o. female with history of HTN and PVCs who presents for evaluation of chest pain.  To recap hx, event monitor 04/2013 showed frequent PVCs - total 8160 (4.8% of the time). F/u 2D echo 06/2013: EF 60-65%, grade 2 DD. Labs 10/2014 showed normal CBC, CMET, TSH, LDL 99. She saw Nada Boozer NP 12/23/14 for BP at which time Diovan was added. BB was considered but she was not having current PVCs and there was fear that her energy level would decrease with BB (training for walk). At f/u on 12/29/15 we added additional dose of Diovan in the PM. She eventually required addition of HCTZ per review of phone notes. However, as she has continued to train for her walk (scheduled in March in [place]), she has required less medication.   She discontinued her HCTZ and had to reduce the Diovan back to once a day due to low BPs. She brings a log of her blood pressures today - both AM and PM levels are normotensive. (She does have occasional elevated PM diastolic values but average trend is in the 1teens/70s-80s). She drinks alcohol nightly and says she's been trying to cut back to 1 glass per night. Over the past several weeks she's had episodic chest pain without particular trigger. When it comes on, it tends to last all day. It gets worse when she twists her upper body. It feels like a tightness in her upper chest and throat and is associated with the sensation of the need to take a breath. It is not associated with exertion. She walked 18 miles with her training group on Sunday 03/07/15 without any exertional limitation. There are no alleviating factors.  05/05/2015  - the patient is  coming after 6 months she brings extensive blood pressure diary with most of blood pressures being very well controlled in the range from 92-120 and very few blood pressures in 140 range. She found out that her blood pressure is low in the morning and continues to rise during the day therefore she takes Diovan at noon. She continues going to gym where she pushes herself and she recently walked 50 miles for [place] without any symptoms. She has experienced chest pain today that she thinks might be attributed to pushing weights on Monday. She denies any palpitations, no dyspnea on exertion no syncope no lower extremity edema orthopnea.   Past Medical History  Diagnosis Date  . IUD 02-23-10    ORIGINALLY INSERTED 01/09/05 AND REPLACE ON 02-23-10  . ADD (attention deficit disorder)   . HGSIL (high grade squamous intraepithelial dysplasia) 2002    LEEP  . Essential hypertension   . PVC's (premature ventricular contractions)     a. Event monitor 04/2013 showed frequent PVCs - total 8160 (4.8% of the time). F/u 2D echo 06/2013: EF 60-65%, grade 2 DD.    Past Surgical History  Procedure Laterality Date  . Cervical biopsy  w/ loop electrode excision  2002    HGSIL  . Wisdom tooth extraction    . Lasix eye surgery  11/2010  . Intrauterine device insertion  02/2010  . Mouth surgery      Current Outpatient  Prescriptions  Medication Sig Dispense Refill  . Calcium Carbonate-Vitamin D (CALCIUM + D PO) Take 1 tablet by mouth daily.     Marland Kitchen. levonorgestrel (MIRENA) 20 MCG/24HR IUD 1 each by Intrauterine route once. INSERTED JAN 2012     . Multiple Vitamin (MULTIVITAMIN) tablet Take 1 tablet by mouth daily.    . Omega-3 Fatty Acids (FISH OIL PO) Take 1 capsule by mouth daily.     . valACYclovir (VALTREX) 1000 MG tablet Take 1,000 mg by mouth as needed (cold sores).     . valsartan (DIOVAN) 160 MG tablet Take 160 mg by mouth daily.     No current facility-administered medications for this visit.    Allergies:    Other   Social History:  The patient  reports that she has never smoked. She has never used smokeless tobacco. She reports that she drinks about 9.0 oz of alcohol per week. She reports that she does not use illicit drugs.   Family History:  The patient's family history includes Cancer in her father; Diabetes in her father; Heart attack in her maternal grandmother; Hyperlipidemia in her father and mother; Hypertension in her father.  ROS:  Please see the history of present illness. No evidence of GI bleeding. All other systems are reviewed and otherwise negative.   PHYSICAL EXAM: VS:  BP 118/74 mmHg  Pulse 76  Ht 5\' 7"  (1.702 m)  Wt 164 lb (74.39 kg)  BMI 25.68 kg/m2  Well nourished, well developed fit appearing WF, in no acute distress HEENT: normocephalic, atraumatic Neck: no JVD or masses Cardiac:  normal S1, S2; RRR; no murmurs, rubs, or gallops Lungs:  clear to auscultation bilaterally, no wheezing, rhonchi or rales Abd: soft, nontender, no hepatomegaly, + BS MS: no deformity or atrophy Ext: no edema Skin: warm and dry, no rash Neuro:  moves all extremities spontaneously, no focal abnormalities noted, follows commands Psych: euthymic mood, full affect  EKG:  Done today shows NSR without acute changes  Recent Labs: 10/29/2014: ALT 8; Hemoglobin 13.1; Platelets 202; TSH 1.398 12/29/2014: BUN 16; Creat 0.60; Potassium 4.4; Sodium 135  10/29/2014: Cholesterol 188; HDL 71; LDL Cholesterol 99; Total CHOL/HDL Ratio 2.6; Triglycerides 89; VLDL 18   CrCl cannot be calculated (Patient has no serum creatinine result on file.).   Wt Readings from Last 3 Encounters:  05/05/15 164 lb (74.39 kg)  12/29/14 161 lb 1.9 oz (73.084 kg)  12/23/14 162 lb (73.483 kg)      ASSESSMENT AND PLAN:  1. Chest pain - atypical. She exercised for 13 minutes without angina - ETT negative.She is most probably experiencing musculoskeletal pain. Considering diastolic dysfunction on her echocardiogram will  continue treating aggressively for blood pressure. 2. Essential HTN - with evidence of diastolic dysfunction on her echocardiogram, continue Diovan her blood pressures are very satisfactory on her diaper.  3. H/o PVCs - briefly seen during ETT as above, but asymptomatic. Continue surveillance.  Follow-up in one year with CMP, CBC, TSH and lipids prior to that appointment.  Rico SheehanSigned, Deby Adger H, MD  05/05/2015 8:46 AM     [facility] [address] [place] [address] [phone]) [phone] (office)  [phone]) [phone] (fax)

## 2015-05-05 NOTE — Patient Instructions (Signed)
Your physician recommends that you continue on your current medications as directed. Please refer to the Current Medication list given to you today.  Your physician recommends that you return for lab work in: 1 year prior to your next visit. (bmet, cbc, tsh, lft, lipids) FASTING  Your physician wants you to follow-up in: 1 YEAR WITH DR. Delton SeeNELSON.  You will receive a reminder letter in the mail two months in advance. If you don't receive a letter, please call our office to schedule the follow-up appointment.

## 2015-05-06 ENCOUNTER — Ambulatory Visit (INDEPENDENT_AMBULATORY_CARE_PROVIDER_SITE_OTHER): Payer: No Typology Code available for payment source

## 2015-05-06 ENCOUNTER — Encounter: Payer: Self-pay | Admitting: Gynecology

## 2015-05-06 ENCOUNTER — Other Ambulatory Visit: Payer: Self-pay | Admitting: Gynecology

## 2015-05-06 ENCOUNTER — Ambulatory Visit (INDEPENDENT_AMBULATORY_CARE_PROVIDER_SITE_OTHER): Payer: No Typology Code available for payment source | Admitting: Gynecology

## 2015-05-06 ENCOUNTER — Ambulatory Visit: Payer: No Typology Code available for payment source | Admitting: Gynecology

## 2015-05-06 VITALS — BP 124/74

## 2015-05-06 DIAGNOSIS — Z30431 Encounter for routine checking of intrauterine contraceptive device: Secondary | ICD-10-CM | POA: Diagnosis not present

## 2015-05-06 DIAGNOSIS — N83202 Unspecified ovarian cyst, left side: Secondary | ICD-10-CM

## 2015-05-06 DIAGNOSIS — N912 Amenorrhea, unspecified: Secondary | ICD-10-CM

## 2015-05-06 LAB — TSH: TSH: 0.7 m[IU]/L

## 2015-05-06 NOTE — Patient Instructions (Signed)
Follow up for IUD if you choose as we discussed.  Follow up for ultrasound in 3 months to relook at the cyst on your left ovary.

## 2015-05-06 NOTE — Progress Notes (Signed)
    Genene ChurnLea Faux 10/16/1970 [id number]        45 y.o.  G0P0 Presents for ultrasound. She was actually seen in September complaining of bloating and the IUD string was not visualized and she was asked to schedule an ultrasound. She is just getting to do that now. Notes that her bloating has resolved. Does note also that she has started back having light menses with her Mirena IUD which is at the five-year mark 02/2015.  Not having significant hot flashes or sweats.  Past medical history,surgical history, problem list, medications, allergies, family history and social history were all reviewed and documented in the EPIC chart.  Directed ROS with pertinent positives and negatives documented in the history of present illness/assessment and plan.  Exam: [place] assistant Filed Vitals:   05/06/15 1245  BP: 124/74   General appearance:  Normal Abdomen soft nontender without masses guarding rebound Pelvic external BUS vagina normal. Cervix normal. IUD string not visualized. Uterus normal size midline mobile nontender. Adnexa without masses or tenderness.  Ultrasound shows uterus normal size with endometrial echo 3.7 mm. IUD within normal position. Right ovary normal. Left ovary with echo-free avascular cyst 39 mm mean. Cul-de-sac negative.  Assessment/Plan:  45 y.o. G0P0 with:  1. Probable physiologic left ovarian cyst. Patient is asymptomatic without pain. Recommend follow up ultrasound in 3 months just to relook at this ovary to make sure it resolves. Patient agrees with the plan and will schedule the ultrasound. 2. Contraception. Patient realizes she is due/overdo to have her Mirena removed. I reviewed contraceptive options with her to include pills, Depo-Provera, Nexplanon, IUDs, sterilization. Patient interested in continuing with the IUD but unfortunately her insurance does not cover contraception and her out-of-pocket cost would be substantial. Reviewed options for Penni BombardLiletta which is a cheaper  alternative but good for 3 years instead of 5. Literature was provided to the patient and she is going to read over this and then decide what she wants to do. I reviewed with estrogen/progesterone contraception such as pill patch ring increased risks of thrombosis. She is otherwise healthy does not smoke and is not being followed for any medical issues. Did recommend checking baseline FSH TSH hCG as she has become amenorrheic most recently after having light regular menses throughout her 5 years of Mirena.    Dara LordsFONTAINE,TIMOTHY P MD, 12:57 PM 05/06/2015

## 2015-05-07 LAB — HCG, SERUM, QUALITATIVE: Preg, Serum: NEGATIVE

## 2015-05-07 LAB — FOLLICLE STIMULATING HORMONE: FSH: 2.7 m[IU]/mL

## 2015-05-10 ENCOUNTER — Telehealth: Payer: Self-pay | Admitting: Gynecology

## 2015-05-10 NOTE — Telephone Encounter (Signed)
05/10/15-Pt called back to state won't get Liletta card for 2-3 wks. Per Sherrilyn RistKari pt can still come on 05/20/15 for insertion but would have to bring in car as soon as she gets it for us to get reimbursed/wl

## 2015-05-10 NOTE — Telephone Encounter (Signed)
05/10/15-Pt was advised today to go online or call Liletta for savings card for IUD. Told she needs to bring with her day of removal/insertion to get special pricing of $75.00 which was explained could be slightly higher depending on what her ins co allows. The removal of existing will be $192 and the insertion of new is $176.00 per Sherrilyn RistKari so pt total upfront cost of $443.00. wl

## 2015-05-20 ENCOUNTER — Encounter: Payer: Self-pay | Admitting: Gynecology

## 2015-05-20 ENCOUNTER — Ambulatory Visit (INDEPENDENT_AMBULATORY_CARE_PROVIDER_SITE_OTHER): Payer: No Typology Code available for payment source | Admitting: Gynecology

## 2015-05-20 VITALS — BP 118/74

## 2015-05-20 DIAGNOSIS — Z30433 Encounter for removal and reinsertion of intrauterine contraceptive device: Secondary | ICD-10-CM | POA: Diagnosis not present

## 2015-05-20 HISTORY — PX: INTRAUTERINE DEVICE INSERTION: SHX323

## 2015-05-20 NOTE — Progress Notes (Signed)
    Genene ChurnLea Arai [birth year] [id number]        45 y.o.  G0P0  presents for Mirena IUD removal and Liletta IUD placement. She has read through the booklet, has no contraindications and signed the consent form..  I reviewed the removal and insertional process with her as well as the risks to include infection, either immediate or long-term, uterine perforation or migration requiring surgery to remove, other complications such as pain, hormonal side effects, infertility and possibility of failure with subsequent pregnancy. She was reminded that the Penni BombardLiletta is good for 3 years at which point it needs to be replaced opposed to the Mirena which was good for 5 years.  Exam with Kennon PortelaKim Gardner assistant Filed Vitals:   05/20/15 0801  BP: 118/74    Pelvic: External BUS vagina normal. Cervix normal, IUD strings not visualized. Uterus anteverted normal size shape contour midline mobile nontender. Adnexa without masses or tenderness.  Procedure: The cervix was visualized with the speculum and using the [facility] forcep opening and closing within the lower endocervical canal the Mirena IUD strings were grasped and the old Mirena IUD was removed, shown to the patient and discarded. The cervix was then cleansed with Betadine, anterior lip grasped with a single-tooth tenaculum, the uterus was sounded and a Liletta IUD was placed according to manufacturer's recommendations without difficulty. The strings were trimmed. The patient tolerated well and will follow up in one month for a postinsertional check.    Lot number:  [id number] Expiration date: 05/2018    Dara LordsFONTAINE,Jomar Denz P MD, 8:41 AM 05/20/2015

## 2015-05-20 NOTE — Patient Instructions (Signed)
Intrauterine Device Insertion Most often, an intrauterine device (IUD) is inserted into the uterus to prevent pregnancy. There are 2 types of IUDs available:  Copper IUD--This type of IUD creates an environment that is not favorable to sperm survival. The mechanism of action of the copper IUD is not known for certain. It can stay in place for 10 years.  Hormone IUD--This type of IUD contains the hormone progestin (synthetic progesterone). The progestin thickens the cervical mucus and prevents sperm from entering the uterus, and it also thins the uterine lining. There is no evidence that the hormone IUD prevents implantation. One hormone IUD can stay in place for up to 5 years, and a different hormone IUD can stay in place for up to 3 years. An IUD is the most cost-effective birth control if left in place for the full duration. It may be removed at any time. LET YOUR HEALTH CARE PROVIDER KNOW ABOUT:  Any allergies you have.  All medicines you are taking, including vitamins, herbs, eye drops, creams, and over-the-counter medicines.  Previous problems you or members of your family have had with the use of anesthetics.  Any blood disorders you have.  Previous surgeries you have had.  Possibility of pregnancy.  Medical conditions you have. RISKS AND COMPLICATIONS  Generally, intrauterine device insertion is a safe procedure. However, as with any procedure, complications can occur. Possible complications include:  Accidental puncture (perforation) of the uterus.  Accidental placement of the IUD either in the muscle layer of the uterus (myometrium) or outside the uterus. If this happens, the IUD can be found essentially floating around the bowels and must be taken out surgically.  The IUD may fall out of the uterus (expulsion). This is more common in women who have recently had a child.   Pregnancy in the fallopian tube (ectopic).  Pelvic inflammatory disease (PID), which is infection of  the uterus and fallopian tubes. The risk of PID is slightly increased in the first 20 days after the IUD is placed, but the overall risk is still very low. BEFORE THE PROCEDURE  Schedule the IUD insertion for when you will have your menstrual period or right after, to make sure you are not pregnant. Placement of the IUD is better tolerated shortly after a menstrual cycle.  You may need to take tests or be examined to make sure you are not pregnant.  You may be required to take a pregnancy test.  You may be required to get checked for sexually transmitted infections (STIs) prior to placement. Placing an IUD in someone who has an infection can make the infection worse.  You may be given a pain reliever to take 1 or 2 hours before the procedure.  An exam will be performed to determine the size and position of your uterus.  Ask your health care provider about changing or stopping your regular medicines. PROCEDURE   A tool (speculum) is placed in the vagina. This allows your health care provider to see the lower part of the uterus (cervix).  The cervix is prepped with a medicine that lowers the risk of infection.  You may be given a medicine to numb each side of the cervix (intracervical or paracervical block). This is used to block and control any discomfort with insertion.  A tool (uterine sound) is inserted into the uterus to determine the length of the uterine cavity and the direction the uterus may be tilted.  A slim instrument (IUD inserter) is inserted through the cervical   canal and into your uterus.  The IUD is placed in the uterine cavity and the insertion device is removed.  The nylon string that is attached to the IUD and used for eventual IUD removal is trimmed. It is trimmed so that it lays high in the vagina, just outside the cervix. AFTER THE PROCEDURE  You may have bleeding after the procedure. This is normal. It varies from light spotting for a few days to menstrual-like  bleeding.  You may have mild cramping.   This information is not intended to replace advice given to you by your health care provider. Make sure you discuss any questions you have with your health care provider.   Document Released: 09/21/2010 Document Revised: 11/13/2012 Document Reviewed: 07/14/2012 Elsevier Interactive Patient Education 2016 [employer] Inc.  

## 2015-07-28 ENCOUNTER — Ambulatory Visit (INDEPENDENT_AMBULATORY_CARE_PROVIDER_SITE_OTHER): Payer: No Typology Code available for payment source | Admitting: Gynecology

## 2015-07-28 ENCOUNTER — Encounter: Payer: Self-pay | Admitting: Gynecology

## 2015-07-28 ENCOUNTER — Ambulatory Visit (INDEPENDENT_AMBULATORY_CARE_PROVIDER_SITE_OTHER): Payer: No Typology Code available for payment source

## 2015-07-28 VITALS — BP 118/74

## 2015-07-28 DIAGNOSIS — N83202 Unspecified ovarian cyst, left side: Secondary | ICD-10-CM

## 2015-07-28 DIAGNOSIS — Z30431 Encounter for routine checking of intrauterine contraceptive device: Secondary | ICD-10-CM | POA: Diagnosis not present

## 2015-07-28 NOTE — Progress Notes (Signed)
    Emma Barajas March 10, 1970 [id number]        45 y.o.  G0P0 presents with 2 issues:  1. IUD follow up. Had Liletta IUD placed 01/19/2016. Is doing well without complaints. Here for follow up exam. 2. Follow up ultrasound with left ovary showing 39 mm echo-free avascular cyst 05/06/2015. Was recommended for follow up ultrasound to make sure this resolves. She's having no pain or other symptoms.  Past medical history,surgical history, problem list, medications, allergies, family history and social history were all reviewed and documented in the EPIC chart.  Directed ROS with pertinent positives and negatives documented in the history of present illness/assessment and plan.  Exam: Kennon PortelaKim Gardner assistant Filed Vitals:   07/28/15 0942  BP: 118/74   General appearance:  Normal Abdomen soft nontender without masses guarding rebound Pelvic external BUS vagina normal. Cervix normal. IUD string a little long and was trimmed. Uterus normal size midline mobile nontender. Adnexa without masses or tenderness  Ultrasound shows uterus normal size and echotexture. Endometrial echo 3.5 mm. IUD within proper location. Right and left ovaries visualized and normal. Prior left ovarian cyst resolved. Cul-de-sac negative.  Assessment/Plan:  45 y.o. G0P0 with normal follow up IUD check. Resolution of the prior left ovarian cyst. Is due for her annual this coming fall and will follow up at that time. Will follow up sooner if any issues.    Dara LordsFONTAINE,Alora Gorey P MD, 9:48 AM 07/28/2015

## 2015-08-04 ENCOUNTER — Ambulatory Visit: Payer: No Typology Code available for payment source | Admitting: Gynecology

## 2015-08-04 ENCOUNTER — Other Ambulatory Visit: Payer: No Typology Code available for payment source

## 2015-10-21 ENCOUNTER — Encounter: Payer: Self-pay | Admitting: Gynecology

## 2015-11-03 ENCOUNTER — Ambulatory Visit (INDEPENDENT_AMBULATORY_CARE_PROVIDER_SITE_OTHER): Payer: PRIVATE HEALTH INSURANCE | Admitting: Gynecology

## 2015-11-03 ENCOUNTER — Encounter: Payer: Self-pay | Admitting: Gynecology

## 2015-11-03 VITALS — BP 118/74 | Ht 67.0 in | Wt 160.0 lb

## 2015-11-03 DIAGNOSIS — Z13220 Encounter for screening for lipoid disorders: Secondary | ICD-10-CM

## 2015-11-03 DIAGNOSIS — Z01419 Encounter for gynecological examination (general) (routine) without abnormal findings: Secondary | ICD-10-CM

## 2015-11-03 LAB — CBC WITH DIFFERENTIAL/PLATELET
BASOS PCT: 1 %
Basophils Absolute: 58 cells/uL (ref 0–200)
EOS ABS: 58 {cells}/uL (ref 15–500)
Eosinophils Relative: 1 %
HEMATOCRIT: 32.8 % — AB (ref 35.0–45.0)
Hemoglobin: 10.5 g/dL — ABNORMAL LOW (ref 11.7–15.5)
LYMPHS PCT: 24 %
Lymphs Abs: 1392 cells/uL (ref 850–3900)
MCH: 29.2 pg (ref 27.0–33.0)
MCHC: 32 g/dL (ref 32.0–36.0)
MCV: 91.4 fL (ref 80.0–100.0)
MONO ABS: 406 {cells}/uL (ref 200–950)
MPV: 9.3 fL (ref 7.5–12.5)
Monocytes Relative: 7 %
Neutro Abs: 3886 cells/uL (ref 1500–7800)
Neutrophils Relative %: 67 %
Platelets: 197 10*3/uL (ref 140–400)
RBC: 3.59 MIL/uL — AB (ref 3.80–5.10)
RDW: 14.7 % (ref 11.0–15.0)
WBC: 5.8 10*3/uL (ref 3.8–10.8)

## 2015-11-03 LAB — COMPREHENSIVE METABOLIC PANEL
ALK PHOS: 46 U/L (ref 33–115)
ALT: 7 U/L (ref 6–29)
AST: 17 U/L (ref 10–35)
Albumin: 4.1 g/dL (ref 3.6–5.1)
BILIRUBIN TOTAL: 0.7 mg/dL (ref 0.2–1.2)
BUN: 14 mg/dL (ref 7–25)
CALCIUM: 8.7 mg/dL (ref 8.6–10.2)
CO2: 25 mmol/L (ref 20–31)
Chloride: 102 mmol/L (ref 98–110)
Creat: 0.68 mg/dL (ref 0.50–1.10)
GLUCOSE: 86 mg/dL (ref 65–99)
Potassium: 4.1 mmol/L (ref 3.5–5.3)
Sodium: 134 mmol/L — ABNORMAL LOW (ref 135–146)
TOTAL PROTEIN: 6.5 g/dL (ref 6.1–8.1)

## 2015-11-03 LAB — LIPID PANEL
CHOLESTEROL: 170 mg/dL (ref 125–200)
HDL: 67 mg/dL (ref >=46)
LDL Cholesterol: 85 mg/dL (ref <130)
Total CHOL/HDL Ratio: 2.5 Ratio (ref <=5.0)
Triglycerides: 90 mg/dL (ref <150)
VLDL: 18 mg/dL (ref <30)

## 2015-11-03 NOTE — Patient Instructions (Signed)

## 2015-11-03 NOTE — Progress Notes (Signed)
    Genene ChurnLea Zane Sep 15, 1970 [id number]        45 y.o.  G0P0  for annual exam.  Doing well without complaints  Past medical history,surgical history, problem list, medications, allergies, family history and social history were all reviewed and documented as reviewed in the EPIC chart.  ROS:  Performed with pertinent positives and negatives included in the history, assessment and plan.   Additional significant findings :  None   Exam: Kennon PortelaKim Gardner assistant Vitals:   11/03/15 0757  BP: 118/74  Weight: 160 lb (72.6 kg)  Height: 5\' 7"  (1.702 m)   Body mass index is 25.06 kg/m.  General appearance:  Normal affect, orientation and appearance. Skin: Grossly normal HEENT: Without gross lesions.  No cervical or supraclavicular adenopathy. Thyroid normal.  Lungs:  Clear without wheezing, rales or rhonchi Cardiac: RR, without RMG Abdominal:  Soft, nontender, without masses, guarding, rebound, organomegaly or hernia Breasts:  Examined lying and sitting without masses, retractions, discharge or axillary adenopathy. Pelvic:  Ext, BUS, Vagina normal  Cervix normal. IUD string visualized with colposcope at the external os  Uterus anteverted, normal size, shape and contour, midline and mobile nontender   Adnexa without masses or tenderness    Anus and perineum normal   Rectovaginal normal sphincter tone without palpated masses or tenderness.    Assessment/Plan:  45 y.o. G0P0 female for annual exam without menses, IUD contraception.   1. Liletta IUD 05/2015. Doing well without menses. IUD string visualized with colposcope. 2. Pap smear/HPV 02/2012. No Pap smear done today. LEEP 2002 with normal Pap smears afterwards. Plan repeat Pap smear approaching 5 year interval per current screening guidelines. 3. Mammography 10/2015. Continue with annual mammography when due. SBE monthly reviewed. 4. Health maintenance. Baseline CBC, lipid profile, CMG, urinalysis ordered. Follow up 1 year, sooner as  needed.   Dara LordsFONTAINE,Lakshmi Sundeen P MD, 8:24 AM 11/03/2015

## 2015-11-04 ENCOUNTER — Other Ambulatory Visit: Payer: Self-pay | Admitting: *Deleted

## 2015-11-04 DIAGNOSIS — D649 Anemia, unspecified: Secondary | ICD-10-CM

## 2015-11-04 LAB — URINALYSIS W MICROSCOPIC + REFLEX CULTURE
BACTERIA UA: NONE SEEN [HPF]
Bilirubin Urine: NEGATIVE
CASTS: NONE SEEN [LPF]
CRYSTALS: NONE SEEN [HPF]
Glucose, UA: NEGATIVE
Hgb urine dipstick: NEGATIVE
KETONES UR: NEGATIVE
Leukocytes, UA: NEGATIVE
Nitrite: NEGATIVE
PROTEIN: NEGATIVE
RBC / HPF: NONE SEEN RBC/HPF (ref <=2)
SPECIFIC GRAVITY, URINE: 1.01 (ref 1.001–1.035)
Squamous Epithelial / LPF: NONE SEEN [HPF] (ref <=5)
WBC, UA: NONE SEEN WBC/HPF (ref <=5)
Yeast: NONE SEEN [HPF]
pH: 7 (ref 5.0–8.0)

## 2015-12-20 ENCOUNTER — Emergency Department (HOSPITAL_COMMUNITY)
Admission: EM | Admit: 2015-12-20 | Discharge: 2015-12-20 | Disposition: A | Discharge status: Home / Self Care | Payer: 59 | Attending: Emergency Medicine | Admitting: Emergency Medicine

## 2015-12-20 ENCOUNTER — Other Ambulatory Visit: Payer: Self-pay

## 2015-12-20 ENCOUNTER — Encounter (HOSPITAL_COMMUNITY): Payer: Self-pay | Admitting: *Deleted

## 2015-12-20 ENCOUNTER — Emergency Department (HOSPITAL_COMMUNITY): Payer: 59

## 2015-12-20 DIAGNOSIS — F909 Attention-deficit hyperactivity disorder, unspecified type: Secondary | ICD-10-CM | POA: Diagnosis not present

## 2015-12-20 DIAGNOSIS — I10 Essential (primary) hypertension: Secondary | ICD-10-CM | POA: Insufficient documentation

## 2015-12-20 LAB — CBC WITH DIFFERENTIAL/PLATELET
BASOS ABS: 0 10*3/uL (ref 0.0–0.1)
BASOS PCT: 0 %
Eosinophils Absolute: 0.2 10*3/uL (ref 0.0–0.7)
Eosinophils Relative: 3 %
HEMATOCRIT: 40.3 % (ref 36.0–46.0)
HEMOGLOBIN: 12.9 g/dL (ref 12.0–15.0)
Lymphocytes Relative: 28 %
Lymphs Abs: 1.8 10*3/uL (ref 0.7–4.0)
MCH: 28.2 pg (ref 26.0–34.0)
MCHC: 32 g/dL (ref 30.0–36.0)
MCV: 88.2 fL (ref 78.0–100.0)
Monocytes Absolute: 0.4 10*3/uL (ref 0.1–1.0)
Monocytes Relative: 7 %
NEUTROS ABS: 3.8 10*3/uL (ref 1.7–7.7)
NEUTROS PCT: 62 %
Platelets: 202 10*3/uL (ref 150–400)
RBC: 4.57 MIL/uL (ref 3.87–5.11)
RDW: 13.2 % (ref 11.5–15.5)
WBC: 6.2 10*3/uL (ref 4.0–10.5)

## 2015-12-20 LAB — URINALYSIS, ROUTINE W REFLEX MICROSCOPIC
BILIRUBIN URINE: NEGATIVE
GLUCOSE, UA: NEGATIVE mg/dL
Hgb urine dipstick: NEGATIVE
KETONES UR: NEGATIVE mg/dL
LEUKOCYTES UA: NEGATIVE
Nitrite: NEGATIVE
PROTEIN: NEGATIVE mg/dL
Specific Gravity, Urine: 1.009 (ref 1.005–1.030)
pH: 7 (ref 5.0–8.0)

## 2015-12-20 LAB — I-STAT BETA HCG BLOOD, ED (MC, WL, AP ONLY)

## 2015-12-20 LAB — I-STAT CHEM 8, ED
BUN: 14 mg/dL (ref 6–20)
CREATININE: 0.7 mg/dL (ref 0.44–1.00)
Calcium, Ion: 1.1 mmol/L — ABNORMAL LOW (ref 1.15–1.40)
Chloride: 103 mmol/L (ref 101–111)
Glucose, Bld: 97 mg/dL (ref 65–99)
HEMATOCRIT: 41 % (ref 36.0–46.0)
HEMOGLOBIN: 13.9 g/dL (ref 12.0–15.0)
POTASSIUM: 4.6 mmol/L (ref 3.5–5.1)
Sodium: 136 mmol/L (ref 135–145)
TCO2: 25 mmol/L (ref 0–100)

## 2015-12-20 LAB — I-STAT TROPONIN, ED: Troponin i, poc: 0 ng/mL (ref 0.00–0.08)

## 2015-12-20 NOTE — ED Provider Notes (Signed)
[facility]EM[facility]ENCY DEPT Provider Note   CSN: [id number] Arrival date & time: 12/20/15  0434     History   Chief Complaint Chief Complaint  Patient presents with  . Hypertension    HPI Emma Barajas is a 45 y.o. female.  The history is provided by the patient.  Hypertension  This is a chronic problem. The current episode started more than 1 week ago. The problem occurs constantly. Progression since onset: waxing and waning. Pertinent negatives include no chest pain, no abdominal pain, no headaches and no shortness of breath. Nothing aggravates the symptoms. Nothing relieves the symptoms. She has tried nothing for the symptoms. The treatment provided no relief.  Patient got her BP under control and stopped her meds and has been on vacation and taking in more sodium than usual and has noted since getting home last evening her BP has been elevated (diastolic).  She took 1/2 an HCTZ and expected to be hypotensive which is what usually happen but it did not happen this time and she came in for and evaluation  Past Medical History:  Diagnosis Date  . ADD (attention deficit disorder)   . Essential hypertension   . HGSIL (high grade squamous intraepithelial dysplasia) 2002   LEEP  . IUD 02-23-10   ORIGINALLY INSERTED 01/09/05 AND REPLACE ON 02-23-10  . PVC's (premature ventricular contractions)    a. Event monitor 04/2013 showed frequent PVCs - total 8160 (4.8% of the time). F/u 2D echo 06/2013: EF 60-65%, grade 2 DD.    Patient Active Problem List   Diagnosis Date Noted  . Chest tightness 03/03/2015  . PVC (premature ventricular contraction) 03/27/2013  . ADD (attention deficit disorder)     Past Surgical History:  Procedure Laterality Date  . CERVICAL BIOPSY  W/ LOOP ELECTRODE EXCISION  2002   HGSIL  . INTRAUTERINE DEVICE INSERTION  05/20/2015   Liletta  . LASIX EYE SURGERY  11/2010  . MOUTH SURGERY    . WISDOM TOOTH EXTRACTION      OB History    Gravida Para Term Preterm AB  Living   0             SAB TAB Ectopic Multiple Live Births                   Home Medications    Prior to Admission medications   Medication Sig Start Date End Date Taking? Authorizing Provider  Calcium Carbonate-Vitamin D (CALCIUM + D PO) Take 1 tablet by mouth daily.     Historical Provider, MD  Coenzyme Q10 (CO Q 10 PO) Take by mouth.    Historical Provider, MD  Levonorgestrel (LILETTA, 52 MG, IU) by Intrauterine route.    Historical Provider, MD  MAGNESIUM PO Take by mouth.    Historical Provider, MD  Multiple Vitamin (MULTIVITAMIN) tablet Take 1 tablet by mouth daily.    Historical Provider, MD  Omega-3 Fatty Acids (FISH OIL PO) Take 1 capsule by mouth daily.     Historical Provider, MD  valACYclovir (VALTREX) 1000 MG tablet Take 1,000 mg by mouth as needed (cold sores).  02/02/13   Historical Provider, MD  valsartan (DIOVAN) 160 MG tablet Take 160 mg by mouth daily.    Historical Provider, MD    Family History Family History  Problem Relation Age of Onset  . Hyperlipidemia Mother   . Cancer Father     PROSTATE  . Hypertension Father   . Hyperlipidemia Father   . Diabetes Father   .  Heart attack Maternal Grandmother   . Hypertension Brother     Social History Social History  Substance Use Topics  . Smoking status: Never Smoker  . Smokeless tobacco: Never Used  . Alcohol use 9.0 oz/week    15 Standard drinks or equivalent per week     Allergies   Other   Review of Systems Review of Systems  Constitutional: Negative for diaphoresis and fever.  Respiratory: Negative for shortness of breath.   Cardiovascular: Negative for chest pain, palpitations and leg swelling.  Gastrointestinal: Negative for abdominal pain.  Neurological: Negative for dizziness, tremors, seizures, syncope, facial asymmetry, speech difficulty, weakness, light-headedness, numbness and headaches.  All other systems reviewed and are negative.    Physical Exam Updated Vital Signs BP (!)  131/102 (BP Location: Right Arm)   Pulse 82   Temp 97.7 F (36.5 C) (Oral)   Resp 17   Ht 5\' 7"  (1.702 m)   Wt 160 lb (72.6 kg)   SpO2 98%   BMI 25.06 kg/m   Physical Exam  Constitutional: She is oriented to person, place, and time. She appears well-developed and well-nourished. No distress.  HENT:  Head: Normocephalic and atraumatic.  Mouth/Throat: No oropharyngeal exudate.  Eyes: EOM are normal. Pupils are equal, round, and reactive to light.  Neck: Normal range of motion. Neck supple. No JVD present. No tracheal deviation present.  Cardiovascular: Normal rate, regular rhythm and intact distal pulses.   Pulmonary/Chest: Effort normal and breath sounds normal. No stridor. She has no wheezes. She has no rales.  Abdominal: Soft. Bowel sounds are normal. She exhibits no mass. There is no tenderness. There is no rebound and no guarding.  Musculoskeletal: Normal range of motion. She exhibits no edema or tenderness.  Neurological: She is alert and oriented to person, place, and time.  Skin: Skin is warm and dry. Capillary refill takes less than 2 seconds.  Psychiatric: She has a normal mood and affect.     ED Treatments / Results   Vitals:   12/20/15 0443  BP: (!) 131/102  Pulse: 82  Resp: 17  Temp: 97.7 F (36.5 C)     EKG Interpretation  Date/Time:  Monday December 20 2015 04:39:20 EST Ventricular Rate:  74 PR Interval:  132 QRS Duration: 82 QT Interval:  404 QTC Calculation: 448 R Axis:   89 Text Interpretation:  Normal sinus rhythm Confirmed by [facility]  [facility], Shailen Thielen ([id number]) on 12/20/2015 5:06:06 AM       Radiology Dg Chest 2 View  Result Date: 12/20/2015 CLINICAL DATA:  Hypertension.  Dyspnea. EXAM: CHEST  2 VIEW COMPARISON:  None. FINDINGS: The lungs are clear. The pulmonary vasculature is normal. Heart size is normal. Hilar and mediastinal contours are unremarkable. There is no pleural effusion. IMPRESSION: No active cardiopulmonary disease. Electronically  Signed   By: Ellery Plunkaniel R Mitchell M.D.   On: 12/20/2015 05:25    Procedures Procedures  Final Clinical Impressions(s) / ED Diagnoses   Patient has been eating differently and not sleeping well. I suspect she has had a marked increase in her sodium intake.  Increase your water intake, sleep 8 hours a day and follow up with your doctor.  Based on the JNC 7 there is no indication for treatment at this level.     All questions answered to patient's satisfaction. Based on history and exam patient has been appropriately medically screened and emergency conditions excluded. Patient is stable for discharge at this time. Follow up with your PMD for  recheck in 2 days and strict return precautions given.    Cy BlamerApril Yaphet Smethurst, MD 12/20/15 [id number]

## 2015-12-20 NOTE — ED Notes (Signed)
To x-ray

## 2015-12-20 NOTE — ED Triage Notes (Signed)
The pt has been on vacation for the past 2 weeks in [place]. For the past 2-3 days she has had chest tightness and her bp has been high. In the past she wore a halter monitor but nothing recent

## 2016-02-01 ENCOUNTER — Other Ambulatory Visit: Payer: Self-pay | Admitting: Cardiology

## 2016-03-29 ENCOUNTER — Telehealth: Payer: Self-pay | Admitting: Cardiology

## 2016-03-29 NOTE — Telephone Encounter (Signed)
Pt want to know does she need to come in for her follow up w/Dr Delton SeeNelson since she was seen in ER and had test done in Nov. Pt stated Dr Delton SeeNelson can see results in the system. Please advise pt [phone]

## 2016-03-29 NOTE — Telephone Encounter (Signed)
Scheduler called the pt today to arrange her lab appt and follow-up appt with Dr Delton SeeNelson, as ordered at pts last OV a year ago.   Pt informed the scheduler that she was recently seen in the hospital back in Nov 2017, where she had a multitude of test and labs ran, and she would like for Dr Delton SeeNelson to reference on that, for the pt states that he medical bill from that visit was "astronomical."  Pt states that she is asymptomatic from a cardiac perspective, and has been asymptomatic since her prior ER visit in Nov 2017.   Pt states that the only thing Dr Delton SeeNelson really follows and refills is the pts Valsartan 160 mg po BID.   Pt would like to know if Dr Delton SeeNelson would follow her on an "as needed basis," for she is financially not able to come in for her yearly visit.   Pt states she will be due for a refill of her Valsartan from Dr Delton SeeNelson, and understands if she will need her to come in for at least labs, to refill this.   Pt states she has enough on-hand for a couple weeks, but will need refills on this med in the near future.   Informed the pt that I will route this message to Dr Delton SeeNelson for further review and recommendation on follow-up request, as well as needed refill.  Informed the pt that I will follow-up with her once recommendations are provided.   Pt verbalized understanding and agrees with this plan.

## 2016-03-30 MED ORDER — VALSARTAN 160 MG PO TABS: 160.0000 mg | ORAL_TABLET | Freq: Two times a day (BID) | ORAL | 3 refills | Status: DC

## 2016-03-30 NOTE — Telephone Encounter (Signed)
We can refill valsartan, when necessary follow-up is okay, she and also follow with her primary care physician. If she is concerned about bills from emergency department she should contact hospital directly, I can't comment on that since I was not involved in her care.

## 2016-03-30 NOTE — Telephone Encounter (Signed)
Notified the pt of Dr Lindaann SloughNelson's recommendations.  Sent the pt a 90 day supply into her confirmed pharmacy of choice.  Pt verbalized understanding and gracious for all the assistance provided.

## 2016-07-19 ENCOUNTER — Telehealth: Payer: Self-pay | Admitting: Cardiology

## 2016-07-19 NOTE — Telephone Encounter (Signed)
Pt calling in just to inform Dr Delton SeeNelson that she would still like to see her on an as needed basis, for any issues that may come up cardiac wise, and for refill of her cardiac med.  Pt states that she has been seeing Dr Delton SeeNelson on an as needed basis, but today she received a call from office from the automated voicemail , prompting her to schedule her yearly appt with Dr Delton SeeNelson.  Pt wanted to just clarify that following up with her on an as needed basis is ok? Pt states she will be having her PCP do a yearly physical on her in the near future, and Dr Delton SeeNelson can view her labs when that's complete.  Pt states this will include lipids as well.  Pt states she has no cardiac issues at all.  Informed the pt that this should be fine and I will route this message to Dr Delton SeeNelson as a general FYI.  Advised the pt to inform us, when her labs are available for review.  Pt verbalized understanding and agrees with this plan.

## 2016-07-19 NOTE — Telephone Encounter (Signed)
New Message:    Please call,concerning her Annual Exam.

## 2016-07-24 ENCOUNTER — Encounter: Payer: Self-pay | Admitting: Internal Medicine

## 2016-08-02 NOTE — Telephone Encounter (Signed)
New Message  Pt call requesting to speak with RN received lab work that was to be faxed over. Please call back to discuss

## 2016-08-02 NOTE — Telephone Encounter (Signed)
Pt calling to request an appt with Dr Delton SeeNelson, for BP management.  Pt states that her BP has been running higher than usual, accompanied with a HA.  Pt states she's overdue as well for her yearly follow-up appt with Dr Delton SeeNelson.  Offered the pt an appt with Dr Delton SeeNelson on this Friday 6/29 at 0940.  Pt verbalized understanding and agreed to appt date and time. Advised the pt to continue her current med regimen.  Advised the pt to lower her sodium intake, until this appt. Pt agreed to this plan.

## 2016-08-04 ENCOUNTER — Ambulatory Visit (INDEPENDENT_AMBULATORY_CARE_PROVIDER_SITE_OTHER): Payer: 59 | Admitting: Cardiology

## 2016-08-04 ENCOUNTER — Ambulatory Visit (INDEPENDENT_AMBULATORY_CARE_PROVIDER_SITE_OTHER)
Admission: RE | Admit: 2016-08-04 | Discharge: 2016-08-04 | Disposition: A | Discharge status: Home / Self Care | Payer: 59 | Source: Ambulatory Visit | Attending: Cardiology | Admitting: Cardiology

## 2016-08-04 VITALS — BP 140/80 | HR 90 | Ht 67.0 in | Wt 157.0 lb

## 2016-08-04 DIAGNOSIS — R059 Cough, unspecified: Secondary | ICD-10-CM

## 2016-08-04 DIAGNOSIS — I10 Essential (primary) hypertension: Secondary | ICD-10-CM

## 2016-08-04 DIAGNOSIS — R05 Cough: Secondary | ICD-10-CM

## 2016-08-04 MED ORDER — LORATADINE 10 MG PO TABS: 10.0000 mg | ORAL_TABLET | Freq: Every day | ORAL | 2 refills | Status: DC

## 2016-08-04 MED ORDER — DILTIAZEM HCL ER COATED BEADS 180 MG PO CP24: 180.0000 mg | ORAL_CAPSULE | Freq: Every day | ORAL | 3 refills | Status: DC

## 2016-08-04 MED ORDER — AZITHROMYCIN 250 MG PO TABS: ORAL_TABLET | ORAL | 0 refills | Status: DC

## 2016-08-04 NOTE — Progress Notes (Signed)
Patient ID: Emma Barajas, female   DOB: January 16, 1971, 46 y.o.   MRN: [id number]      Cardiology Office Note Date:  08/04/2016  Patient ID:  Emma Barajas, DOB 09/29/1970, MRN [id number] PCP:  [name], MD  Cardiologist: Delton See  Chief Complaint: chest pain, hypertension  History of Present Illness: Emma Barajas is a 46 y.o. female with history of HTN and PVCs who presents for evaluation of chest pain.  To recap hx, event monitor 04/2013 showed frequent PVCs - total 8160 (4.8% of the time). F/u 2D echo 06/2013: EF 60-65%, grade 2 DD. Labs 10/2014 showed normal CBC, CMET, TSH, LDL 99. She saw Nada Boozer NP 12/23/14 for BP at which time Diovan was added. BB was considered but she was not having current PVCs and there was fear that her energy level would decrease with BB (training for walk). At f/u on 12/29/15 we added additional dose of Diovan in the PM. She eventually required addition of HCTZ per review of phone notes. However, as she has continued to train for her walk (scheduled in March in [place]), she has required less medication.   She discontinued her HCTZ and had to reduce the Diovan back to once a day due to low BPs. She brings a log of her blood pressures today - both AM and PM levels are normotensive. (She does have occasional elevated PM diastolic values but average trend is in the 1teens/70s-80s). She drinks alcohol nightly and says she's been trying to cut back to 1 glass per night. Over the past several weeks she's had episodic chest pain without particular trigger. When it comes on, it tends to last all day. It gets worse when she twists her upper body. It feels like a tightness in her upper chest and throat and is associated with the sensation of the need to take a breath. It is not associated with exertion. She walked 18 miles with her training group on Sunday 03/07/15 without any exertional limitation. There are no alleviating factors.  05/05/2015  - the patient is  coming after 6 months she brings extensive blood pressure diary with most of blood pressures being very well controlled in the range from 92-120 and very few blood pressures in 140 range. She found out that her blood pressure is low in the morning and continues to rise during the day therefore she takes Diovan at noon. She continues going to gym where she pushes herself and she recently walked 50 miles for [place] without any symptoms. She has experienced chest pain today that she thinks might be attributed to pushing weights on Monday. She denies any palpitations, no dyspnea on exertion no syncope no lower extremity edema orthopnea.   08/04/2016, the patient is coming after 15 months, she's been experiencing elevated blood pressure for the last 2 weeks, she has been taking valsartan 160 mg daily, when she tried to increase it by another half pill of 80 mg daily she develop nagging cough. In the last 2 days she's been experiencing subjective fever and chills, her cough is nonproductive. In the past she was started on hydrochlorothiazide caused episode of hypotension. She denies any chest pain, she has been feeling profoundly tired in the last couple weeks that is very unusual for her. She denies any dizziness or syncope no lower extremity edema.  Lab work performed on 07/25/2016 shows creatinine 0.5, AST 20, ALT 9, triglycerides 81, HDL 71, LDL 98.  Past Medical History:  Diagnosis Date  . ADD (  attention deficit disorder)   . Essential hypertension   . HGSIL (high grade squamous intraepithelial dysplasia) 2002   LEEP  . IUD 02-23-10   ORIGINALLY INSERTED 01/09/05 AND REPLACE ON 02-23-10  . PVC's (premature ventricular contractions)    a. Event monitor 04/2013 showed frequent PVCs - total 8160 (4.8% of the time). F/u 2D echo 06/2013: EF 60-65%, grade 2 DD.    Past Surgical History:  Procedure Laterality Date  . CERVICAL BIOPSY  W/ LOOP ELECTRODE EXCISION  2002   HGSIL  . INTRAUTERINE DEVICE INSERTION   05/20/2015   Liletta  . LASIX EYE SURGERY  11/2010  . MOUTH SURGERY    . WISDOM TOOTH EXTRACTION      Current Outpatient Prescriptions  Medication Sig Dispense Refill  . Calcium Carbonate-Vitamin D (CALCIUM + D PO) Take 1 tablet by mouth daily.     . Coenzyme Q10 (CO Q 10 PO) Take 100 mg by mouth daily.     . Levonorgestrel (LILETTA, 52 MG, IU) by Intrauterine route.    Marland Kitchen. MAGNESIUM PO Take 125 mg by mouth daily.     . Multiple Vitamin (MULTIVITAMIN) tablet Take 1 tablet by mouth as needed.     . Omega-3 Fatty Acids (FISH OIL PO) Take 1 capsule by mouth daily.     . valACYclovir (VALTREX) 1000 MG tablet Take 1,000 mg by mouth as needed (cold sores).     . valsartan (DIOVAN) 160 MG tablet Take 1 tablet (160 mg total) by mouth 2 (two) times daily. (Patient taking differently: Take 160 mg by mouth daily. ) 180 tablet 3   No current facility-administered medications for this visit.     Allergies:   Other   Social History:  The patient  reports that she has never smoked. She has never used smokeless tobacco. She reports that she drinks about 9.0 oz of alcohol per week . She reports that she does not use drugs.   Family History:  The patient's family history includes Cancer in her father; Diabetes in her father; Heart attack in her maternal grandmother; Hyperlipidemia in her father and mother; Hypertension in her brother and father.  ROS:  Please see the history of present illness. No evidence of GI bleeding. All other systems are reviewed and otherwise negative.   PHYSICAL EXAM: VS:  BP 140/80   Pulse 90   Ht 5\' 7"  (1.702 m)   Wt 157 lb (71.2 kg)   BMI 24.59 kg/m   Well nourished, well developed fit appearing WF, in no acute distress  HEENT: normocephalic, atraumatic  Neck: no JVD or masses Cardiac:  normal S1, S2; RRR; no murmurs, rubs, or gallops Lungs:  clear to auscultation bilaterally, no wheezing, rhonchi or rales  Abd: soft, nontender, no hepatomegaly, + BS MS: no deformity  or atrophy Ext: no edema  Skin: warm and dry, no rash Neuro:  moves all extremities spontaneously, no focal abnormalities noted, follows commands Psych: euthymic mood, full affect  EKG:  Done today shows NSR without acute changes  Recent Labs: 11/03/2015: ALT 7 12/20/2015: BUN 14; Creatinine, Ser 0.70; Hemoglobin 13.9; Platelets 202; Potassium 4.6; Sodium 136  11/03/2015: Cholesterol 170; HDL 67; LDL Cholesterol 85; Total CHOL/HDL Ratio 2.5; Triglycerides 90; VLDL 18   CrCl cannot be calculated (Patient's most recent lab result is older than the maximum 21 days allowed.).   Wt Readings from Last 3 Encounters:  08/04/16 157 lb (71.2 kg)  12/20/15 160 lb (72.6 kg)  11/03/15 160 lb (  72.6 kg)    Her EKG performed today 08/05/2015 was personally reviewed and shows normal sinus rhythm, normal EKG and is unchanged from prior.   ASSESSMENT AND PLAN:  1. Nonproductive cough fever chills, we will order a chest x-ray, start Z-Pak for 3 days, she is advised to start taking Claritin 10 mg daily. We will discontinue valsartan as it might be causing cough. Her heart rate is on the higher end, we will start Cardizem CD 180 mg daily. 2. Chest pain - atypical. She exercised for 13 minutes without angina - ETT negative.She is most probably experiencing musculoskeletal pain. Considering diastolic dysfunction on her echocardiogram will continue treating aggressively for blood pressure. 3. Essential HTN - with evidence of diastolic dysfunction on her echocardiogram, discontinue Diovan, start Cardizem CD 180 mg daily, we will arrange for follow-up appointment with blood pressure clinic in 2 weeks. 4. H/o PVCs - briefly seen during ETT as above, but asymptomatic. Continue surveillance.  Signed, Tobias Alexander, MD  08/04/2016 10:11 AM     [facility] [address] [place] [address] [phone] (office)  [phone] (fax)

## 2016-08-04 NOTE — Patient Instructions (Signed)
Medication Instructions:   STOP TAKING VALSARTAN  START TAKING CARDIZEM CD 180 MG ONCE DAILY  START TAKING CLARITIN 10 MG ONCE DAILY  START TAKING Z-PAK 250 MG--TAKE 2 TABS TODAY AS YOUR 1ST INITIAL DOSE, THEN TAKE 1 TAB BY MOUTH DAILY THEREAFTER, UNTIL PACK COMPLETE    Testing/Procedures:  A chest x-ray takes a picture of the organs and structures inside the chest, including the heart, lungs, and blood vessels. This test can show several things, including, whether the heart is enlarges; whether fluid is building up in the lungs; and whether pacemaker / defibrillator leads are still in place.  PLEASE GO TO [facility] ON [address] TO HAVE THIS DONE---ADDRESS IS----[address] [place], [place] [id number]    Follow-Up:  2 WEEKS IN HYPERTENSION CLINIC  4 MONTHS WITH DR Delton SeeNELSON       If you need a refill on your cardiac medications before your next appointment, please call your pharmacy.

## 2016-08-11 ENCOUNTER — Telehealth: Payer: Self-pay | Admitting: Cardiology

## 2016-08-11 NOTE — Telephone Encounter (Signed)
Emma Barajas is calling to give you a update about her medication . Please call ..Marland Kitchen

## 2016-08-11 NOTE — Telephone Encounter (Signed)
Pt calling to inform Dr Nelson that she completed her coursDelton Seee of Z-Pak and she Is still taking Claritin daily, but her cough has worsened, and she is more congested in her chest and face.  Pt states that she feels much worse. Pt states that its making her BP a bit elevated as well, and its running around 150/98.  Pt was wanting Dr Delton SeeNelson to advise on what she should do.  Informed the pt that Dr Delton SeeNelson is out of the office for the next week.  Advised the pt, that given her on-going respiratory complaints and worsening symptoms related to this, she should refer to her PCP or Urgent Care today, for further evaluation and management.  Advised the pt to make sure she's drinking plenty of fluids, to help thin secretions.  Informed the pt that she can call our office sometime next week, to give us an update on what her PCP advised.  Pt verbalized understanding and agrees with this plan.  Will forward this message to Dr Delton SeeNelson as a general FYI.

## 2016-08-17 DIAGNOSIS — I10 Essential (primary) hypertension: Secondary | ICD-10-CM | POA: Insufficient documentation

## 2016-08-17 NOTE — Progress Notes (Signed)
Patient ID: Emma Barajas                 DOB: 04-02-1970                      MRN: [id number]     HPI: Emma Barajas is a 46 y.o. female patient of Dr. Delton See who presents today for hypertension evaluation. PMH includes HTN and PVCs. At her most recent OV with Dr. Delton See, her valsartan was discontinued as it was thought this could be causing a cough and she was started on cardizem CD 180mg  daily.   She reports she has been getting a very bad headache since starting Cardizem. She is also describing pressure in her head. She reports that she has never noticed or felt PVCs, but has been told that she does have these.   She also reports that the cough is much better off of the valsartan, but her pressures are not controlled at all.    Current HTN meds:  Diltiazem CD 180mg  daily   Previously tried: valsartan - cough  BP goal: <130/80  Family History: Cancer in her father; Diabetes in her father; Heart attack in her maternal grandmother; Hyperlipidemia in her father and mother; Hypertension in her brother and father.  Social History: The patient  reports that she has never smoked. She has never used smokeless tobacco. She reports that she drinks about 9.0 oz of alcohol per week . She reports that she does not use drugs.   Diet: She does not salt food. She does not drink coffee or caffeinated products.   Exercise: Not exercise regularly. Walk the dogs and do yoga.   Home BP readings: 130s- 140s/80s-100s.   Wt Readings from Last 3 Encounters:  08/04/16 157 lb (71.2 kg)  12/20/15 160 lb (72.6 kg)  11/03/15 160 lb (72.6 kg)   BP Readings from Last 3 Encounters:  08/18/16 (!) 154/92  08/04/16 140/80  12/20/15 147/98   Pulse Readings from Last 3 Encounters:  08/18/16 68  08/04/16 90  12/20/15 67    Renal function: CrCl cannot be calculated (Patient's most recent lab result is older than the maximum 21 days allowed.).  Past Medical History:  Diagnosis Date  . ADD (attention  deficit disorder)   . Essential hypertension   . HGSIL (high grade squamous intraepithelial dysplasia) 2002   LEEP  . IUD 02-23-10   ORIGINALLY INSERTED 01/09/05 AND REPLACE ON 02-23-10  . PVC's (premature ventricular contractions)    a. Event monitor 04/2013 showed frequent PVCs - total 8160 (4.8% of the time). F/u 2D echo 06/2013: EF 60-65%, grade 2 DD.    Current Outpatient Prescriptions on File Prior to Visit  Medication Sig Dispense Refill  . Calcium Carbonate-Vitamin D (CALCIUM + D PO) Take 1 tablet by mouth daily.     . Coenzyme Q10 (CO Q 10 PO) Take 100 mg by mouth daily.     . Levonorgestrel (LILETTA, 52 MG, IU) by Intrauterine route.    Marland Kitchen MAGNESIUM PO Take 125 mg by mouth daily.     . Multiple Vitamin (MULTIVITAMIN) tablet Take 1 tablet by mouth as needed.     . Omega-3 Fatty Acids (FISH OIL PO) Take 1 capsule by mouth daily.     . valACYclovir (VALTREX) 1000 MG tablet Take 1,000 mg by mouth as needed (cold sores).      No current facility-administered medications on file prior to visit.     Allergies  Allergen Reactions  .  Other Shortness Of Breath    "PARSNIPS"    Blood pressure (!) 154/92, pulse 68.   Assessment/Plan: Hypertension: BP is not controlled. Will stop cardizem and start amlodipine as this tends to be more potent on blood pressure. Will start her at amlodipine 5mg  daily for one week then increase to 10mg  daily. Advised to purchase new cuff and continue to monitor. Follow up in BP clinic in 4-6 weeks.    Thank you, Freddie ApleyKelley M. Cleatis PolkaAuten, PharmD  [facility]

## 2016-08-18 ENCOUNTER — Ambulatory Visit (INDEPENDENT_AMBULATORY_CARE_PROVIDER_SITE_OTHER): Payer: 59 | Admitting: Pharmacist

## 2016-08-18 DIAGNOSIS — I10 Essential (primary) hypertension: Secondary | ICD-10-CM

## 2016-08-18 MED ORDER — AMLODIPINE BESYLATE 10 MG PO TABS: 10.0000 mg | ORAL_TABLET | Freq: Every day | ORAL | 3 refills | Status: DC

## 2016-08-18 NOTE — Patient Instructions (Addendum)
Return for a follow up appointment in 4-6 weeks   Check your blood pressure at home daily (if able) and keep record of the readings.  Take your BP meds as follows: START amlodipine - take 5mg  (1/2 tablet for 1 week) then increase to 10mg  (1 tablet) daily   STOP diltiazem (Cardizem)  Bring all of your meds, your BP cuff and your record of home blood pressures to your next appointment.  Exercise as you're able, try to walk approximately 30 minutes per day.  Keep salt intake to a minimum, especially watch canned and prepared boxed foods.  Eat more fresh fruits and vegetables and fewer canned items.  Avoid eating in fast food restaurants.    HOW TO TAKE YOUR BLOOD PRESSURE: . Rest 5 minutes before taking your blood pressure. .  Don't smoke or drink caffeinated beverages for at least 30 minutes before. . Take your blood pressure before (not after) you eat. . Sit comfortably with your back supported and both feet on the floor (don't cross your legs). . Elevate your arm to heart level on a table or a desk. . Use the proper sized cuff. It should fit smoothly and snugly around your bare upper arm. There should be enough room to slip a fingertip under the cuff. The bottom edge of the cuff should be 1 inch above the crease of the elbow. . Ideally, take 3 measurements at one sitting and record the average.

## 2016-09-14 ENCOUNTER — Encounter: Payer: Self-pay | Admitting: Pharmacist

## 2016-09-14 ENCOUNTER — Ambulatory Visit (INDEPENDENT_AMBULATORY_CARE_PROVIDER_SITE_OTHER): Payer: 59 | Admitting: Pharmacist

## 2016-09-14 VITALS — BP 118/76 | HR 72

## 2016-09-14 DIAGNOSIS — I10 Essential (primary) hypertension: Secondary | ICD-10-CM | POA: Diagnosis not present

## 2016-09-14 NOTE — Progress Notes (Signed)
Patient ID: Emma Barajas                 DOB: 1970-11-01                      MRN: [id number]     HPI: Britlee Skolnik is a 46 y.o. female referred by Dr. Delton See to HTN clinic for evaluation. PMH is significant for HTN and PVCs. At her most recent OV with Dr. Delton See, her valsartan was discontinued as it was thought this could be causing a cough and she was started on cardizem CD 180mg  daily. At her 7/13 clinic visit, her BP was still elevated at 154/92 and cardizem was discontinued. Amlodipine 5 mg daily was started and titrated up to 10 mg daily after one week. Pt presents today for f/u.   Pt presents to clinic in good spirits. She states that she is tolerating the amlodipine 10 mg daily well. Denies dizziness, headaches, lightheadedness, or falls. Reports that she has developed a minor dry cough. She checks her BP at home and readings are usually 100s - 110s / 70s - 80s. Noticed recently that her HR was elevated around the higher end of normal. HR in clinic today was 72, and BP was 118/76.   Current HTN meds: amlodipine 10 mg daily Previously tried: valsartan 160 mg - developed cough, cardizem 180 mg - BP not controlled, headache/pressure in her head.   BP goal: <130/80 mmHg  Family History: Cancer in her father; Diabetes in her father; Heart attack in her maternal grandmother; Hyperlipidemia in her father and mother; Hypertension in her brother and father.  Social History: The patient reports that she has never smoked. She has never used smokeless tobacco. She reports that she drinks about 9.0 oz of alcohol per week . She reports that she does not use drugs.  Diet: She does not salt food. She does not drink coffee or caffeinated products.   Exercise: Not exercise regularly. Walk the dogs and do yoga.   Home BP readings: 100s to 110s SBP and 70s to 80s DBP (after starting amlodipine 10 mg)   Wt Readings from Last 3 Encounters:  08/04/16 157 lb (71.2 kg)  12/20/15 160 lb (72.6 kg)    11/03/15 160 lb (72.6 kg)   BP Readings from Last 3 Encounters:  08/18/16 (!) 154/92  08/04/16 140/80  12/20/15 147/98   Pulse Readings from Last 3 Encounters:  08/18/16 68  08/04/16 90  12/20/15 67    Renal function: CrCl cannot be calculated (Patient's most recent lab result is older than the maximum 21 days allowed.).  Past Medical History:  Diagnosis Date  . ADD (attention deficit disorder)   . Essential hypertension   . HGSIL (high grade squamous intraepithelial dysplasia) 2002   LEEP  . IUD 02-23-10   ORIGINALLY INSERTED 01/09/05 AND REPLACE ON 02-23-10  . PVC's (premature ventricular contractions)    a. Event monitor 04/2013 showed frequent PVCs - total 8160 (4.8% of the time). F/u 2D echo 06/2013: EF 60-65%, grade 2 DD.    Current Outpatient Prescriptions on File Prior to Visit  Medication Sig Dispense Refill  . albuterol (PROVENTIL HFA;VENTOLIN HFA) 108 (90 Base) MCG/ACT inhaler Inhale into the lungs every 6 (six) hours as needed for wheezing or shortness of breath.    Marland Kitchen amLODipine (NORVASC) 10 MG tablet Take 1 tablet (10 mg total) by mouth daily. 30 tablet 3  . benzonatate (TESSALON) 100 MG capsule Take 100 mg by  mouth 3 (three) times daily as needed for cough.    . Calcium Carbonate-Vitamin D (CALCIUM + D PO) Take 1 tablet by mouth daily.     . cetirizine (ZYRTEC ALLERGY) 10 MG tablet Take 10 mg by mouth daily.    . Coenzyme Q10 (CO Q 10 PO) Take 100 mg by mouth daily.     . Levonorgestrel (LILETTA, 52 MG, IU) by Intrauterine route.    Marland Kitchen. MAGNESIUM PO Take 125 mg by mouth daily.     . Multiple Vitamin (MULTIVITAMIN) tablet Take 1 tablet by mouth as needed.     . Omega-3 Fatty Acids (FISH OIL PO) Take 1 capsule by mouth daily.     . valACYclovir (VALTREX) 1000 MG tablet Take 1,000 mg by mouth as needed (cold sores).      No current facility-administered medications on file prior to visit.     Allergies  Allergen Reactions  . Other Shortness Of Breath     "PARSNIPS"     Assessment/Plan:  1. Hypertension: Pt's BP in clinic today was at her goal of <130/80 mmHg. Continue amlodipine 10 mg daily. Instructed pt to take zyrtec once daily for one week to help resolve her minor cough as this is likely unrelated to amlodipine and has helped in the past. Will consider holding amlodipine if cough worsens. Encouraged pt to continue to take BP readings at home and to notify the clinic if her BP or HR increases persistently. F/u in clinic as needed.    This patient was seen with Durward MallardMichael Li, PharmD Student [phone] and I agree with the above assessment and plan.   Thank you, Freddie ApleyKelley M. Cleatis PolkaAuten, PharmD  [facility]  [address], [place], [place] [address]  Phone: [phone]; Fax: [phone] 09/14/2016 11:45 AM

## 2016-09-14 NOTE — Patient Instructions (Signed)
It was good seeing you today!  Your blood pressure today is at your goal. Continue taking amlodipine 10 mg by mouth 1 tablet daily.   Continue checking your blood pressure readings at home and keeping a log.   If you notice your blood pressure or heart rate increasing persistently, please contact the clinic. [phone].   Follow up with the clinic as needed.

## 2016-10-24 ENCOUNTER — Encounter: Payer: Self-pay | Admitting: Gynecology

## 2016-11-03 ENCOUNTER — Encounter: Payer: PRIVATE HEALTH INSURANCE | Admitting: Gynecology

## 2016-11-06 ENCOUNTER — Encounter: Payer: Self-pay | Admitting: Gynecology

## 2016-11-06 ENCOUNTER — Ambulatory Visit (INDEPENDENT_AMBULATORY_CARE_PROVIDER_SITE_OTHER): Payer: PRIVATE HEALTH INSURANCE | Admitting: Gynecology

## 2016-11-06 ENCOUNTER — Encounter: Payer: Self-pay | Admitting: Nurse Practitioner

## 2016-11-06 VITALS — BP 112/76 | Ht 68.0 in | Wt 162.0 lb

## 2016-11-06 DIAGNOSIS — Z30431 Encounter for routine checking of intrauterine contraceptive device: Secondary | ICD-10-CM

## 2016-11-06 DIAGNOSIS — N951 Menopausal and female climacteric states: Secondary | ICD-10-CM | POA: Diagnosis not present

## 2016-11-06 DIAGNOSIS — Z01419 Encounter for gynecological examination (general) (routine) without abnormal findings: Secondary | ICD-10-CM | POA: Diagnosis not present

## 2016-11-06 DIAGNOSIS — Z1151 Encounter for screening for human papillomavirus (HPV): Secondary | ICD-10-CM

## 2016-11-06 NOTE — Progress Notes (Signed)
    Emma Barajas 1970-06-09 [id number]        46 y.o.  G0P0 for annual gynecologic exam.    Past medical history,surgical history, problem list, medications, allergies, family history and social history were all reviewed and documented as reviewed in the EPIC chart.  ROS:  Performed with pertinent positives and negatives included in the history, assessment and plan.   Additional significant findings :  None    Exam: Kennon Portela assistant Vitals:   11/06/16 1102  BP: 112/76  Weight: 162 lb (73.5 kg)  Height:  (1.727 m)   Body mass index is 24.63 kg/m.  General appearance:  Normal affect, orientation and appearance. Skin: Grossly normal HEENT: Without gross lesions.  No cervical or supraclavicular adenopathy. Thyroid normal.  Lungs:  Clear without wheezing, rales or rhonchi Cardiac: RR, without RMG Abdominal:  Soft, nontender, without masses, guarding, rebound, organomegaly or hernia Breasts:  Examined lying and sitting without masses, retractions, discharge or axillary adenopathy. Pelvic:  Ext, BUS, Vagina: Normal  Cervix: Normal. Pap smear/HPV. IUD string palpated at external os  Uterus: Anteverted, normal size, shape and contour, midline and mobile nontender   Adnexa: Without masses or tenderness    Anus and perineum: Normal   Rectovaginal: Normal sphincter tone without palpated masses or tenderness.    Assessment/Plan:  46 y.o. G0P0 female for annual gynecologic exam without menses, Liletta IUD.   1. Hot flushes and sweats over the last several weeks. Mostly hot flushes at night. Occasional daily. Will check baseline FSH/TSH. Issues of menopause reviewed. If lab work normal then recommend follow up with primary physician if continues. 2. Liletta IUD 05/2015. Doing well without menses. 3. Pap smear/HPV 02/2012. Pap smear/HPV today. History of LEEP 2002 with normal Pap smears afterwards. 4. Mammography 10/2016. Continue with annual mammography next year. Breast exam  normal today. 5. Health maintenance. No routine lab work done as patient reports is done elsewhere. Follow up 1 year, sooner as needed.   [name] MD, 11:24 AM 11/06/2016

## 2016-11-06 NOTE — Patient Instructions (Signed)
Office will call you with the blood test results.  Follow up in one year for annual exam.

## 2016-11-06 NOTE — Addendum Note (Signed)
Addended by: Dayna Barker on: 11/06/2016 12:25 PM   Modules accepted: Orders

## 2016-11-07 ENCOUNTER — Encounter: Payer: Self-pay | Admitting: Gynecology

## 2016-11-07 ENCOUNTER — Telehealth: Payer: Self-pay | Admitting: Pharmacist

## 2016-11-07 LAB — PAP IG AND HPV HIGH-RISK: HPV DNA High Risk: NOT DETECTED

## 2016-11-07 LAB — FOLLICLE STIMULATING HORMONE: FSH: 8.1 m[IU]/mL

## 2016-11-07 LAB — TSH: TSH: 2.07 mIU/L

## 2016-11-07 MED ORDER — AMLODIPINE BESYLATE 5 MG PO TABS: 5.0000 mg | ORAL_TABLET | Freq: Every day | ORAL | 11 refills | Status: DC

## 2016-11-07 NOTE — Telephone Encounter (Addendum)
Pt called clinic to report swelling in her knees and ankles. She noticed this ~3 weeks after increasing her amlodipine to  daily. Her BP readings have been very well controlled at home (she is checking BP once a day). Will decrease amlodipine back to  daily and advised pt to monitor her BP at home. She will call clinic if her edema does not resolve or if her BP becomes elevated > 130/80. New Rx sent to pharmacy for lower strength of amlodipine.

## 2016-11-07 NOTE — Addendum Note (Signed)
Addended by: SUPPLE, MEGAN E on: 11/07/2016 11:01 AM   Modules accepted: Orders

## 2016-11-08 ENCOUNTER — Encounter: Payer: Self-pay | Admitting: Gynecology

## 2016-11-15 ENCOUNTER — Encounter: Payer: Self-pay | Admitting: Nurse Practitioner

## 2016-11-15 ENCOUNTER — Ambulatory Visit (INDEPENDENT_AMBULATORY_CARE_PROVIDER_SITE_OTHER): Payer: 59 | Admitting: Nurse Practitioner

## 2016-11-15 VITALS — BP 110/72 | HR 81 | Ht 67.0 in | Wt 162.0 lb

## 2016-11-15 DIAGNOSIS — R0789 Other chest pain: Secondary | ICD-10-CM

## 2016-11-15 NOTE — Patient Instructions (Addendum)
-  trial of naprosyn 220 mg once twice daily x 7 days -omeprazole  once daily 30 minutes prior to breakfast while taking naprosyn  -heating pad at night. -avoid yoga or exercises for 7 days.  -call with condition update after one week. If no improvement may need further testing.   Thank you for choosing me and [facility].   Willette Cluster, NP

## 2016-11-15 NOTE — Progress Notes (Addendum)
Addendum: Labs from PCP dated 9-18. CBC normal with a white count of 6.4, hemoglobin 13. Renal function normal, liver chemistries normal. Amylase and lipase normal. TSH normal. Urinalysis unremarkable.  Ultrasound 10/17/16 shows abnormal pericholecystic fluid collection. No evidence for gallstones.  Ultrasound does change plan to some degree though pain still sounds most compatible with musculoskeletal origin. May need to repeat ultrasound in a few weeks.  HPI:  Patient is 46 year old female with HTN, hx of PVC,  new to this practice, referred by PCP Lindaann Pascal, P.A. for upper abdominal pain. She has a history of hypertension and PVCs followed [facility] Dr[facility].  Jaidon gives a four month history of intermittent diffuse upper abdominal discomfort. Feels like she is sore from working out. Pain doesn't radiate around to, or through to mid / upper back but she has recently developed lower back discomfort. The upper abdominal discomfort can wake her up if she tries to roll over in bed. Reaching up hurts. Yoga stretches can hurt. No urinary sx. No chest pain or SOB .The discomfort can last for days at a time followed by no pain for several days.  No associated gastrointestinal sx such as nausea, vomiting, bowel changes or weight loss. She has gained a few pounds. Tried a probioitc for a month without change in sx. She had a cough in July, given course of Prednisone and that with cough supprressants and Zyrtec helped her cough. The upper abdominal discomfort started after the excessive coughing. No lower GI complaints such as bowel changes or blood in stool. Her weight is stable. Patient is physically active, she likes to hike and exercise   Past Medical History:  Diagnosis Date  . Essential hypertension   . HGSIL (high grade squamous intraepithelial dysplasia) 2002   LEEP  . IUD 02-23-10   ORIGINALLY INSERTED 01/09/05 AND REPLACE ON 02-23-10  . PVC's (premature ventricular contractions)    a. Event monitor 04/2013 showed frequent PVCs - total 8160 (4.8% of the time). F/u 2D echo 06/2013: EF 60-65%, grade 2 DD.     Past Surgical History:  Procedure Laterality Date  . CERVICAL BIOPSY  W/ LOOP ELECTRODE EXCISION  2002   HGSIL  . INTRAUTERINE DEVICE INSERTION  05/20/2015   Liletta  . LASIX EYE SURGERY  11/2010  . MOUTH SURGERY    . WISDOM TOOTH EXTRACTION     Family History  Problem Relation Age of Onset  . Hyperlipidemia Mother   . Hypertension Father   . Hyperlipidemia Father   . Diabetes Father        pre-diabetic  . Prostate cancer Father   . Heart attack Maternal Grandmother   . Heart block Maternal Grandmother   . Heart disease Maternal Grandmother   . Hypertension Brother   . Diabetes Paternal Grandmother   . Colon cancer Neg Hx   . Stomach cancer Neg Hx    Social History  Substance Use Topics  . Smoking status: Never Smoker  . Smokeless tobacco: Never Used  . Alcohol use 9.0 oz/week    15 Standard drinks or equivalent per week     Comment: occ   Current Outpatient Prescriptions  Medication Sig Dispense Refill  . amLODipine (NORVASC) 5 MG tablet Take 1 tablet (5 mg total) by mouth daily. 30 tablet 11  . B Complex-C (SUPER B COMPLEX PO) Take by mouth.    . Calcium Carbonate-Vitamin D (CALCIUM + D PO) Take 1 tablet by mouth daily.     Marland Kitchen  cetirizine (ZYRTEC ALLERGY) 10 MG tablet Take 10 mg by mouth daily.    . Cholecalciferol (VITAMIN D PO) Take by mouth.    . Coenzyme Q10 (CO Q 10 PO) Take 100 mg by mouth daily.     . Levonorgestrel (LILETTA, 52 MG, IU) by Intrauterine route.    Marland Kitchen MAGNESIUM PO Take 125 mg by mouth daily.     . Omega-3 Fatty Acids (FISH OIL PO) Take 1 capsule by mouth daily.     . Probiotic Product (PROBIOTIC DAILY PO) Take by mouth.    . TURMERIC PO Take by mouth.    . valACYclovir (VALTREX) 1000 MG tablet Take 1,000 mg by mouth as needed (cold sores).      No current facility-administered medications for this visit.    Allergies    Allergen Reactions  . Other Shortness Of Breath    "PARSNIPS"     Review of Systems: All systems reviewed and negative except where noted in HPI.    Physical Exam: BP 110/72   Pulse 81   Ht  (1.702 m)   Wt 162 lb (73.5 kg)   BMI 25.37 kg/m  Constitutional:  Well-developed, which female in no acute distress. Psychiatric: Normal mood and affect. Behavior is normal. EENT: Pupils normal.  Conjunctivae are normal. No scleral icterus. Neck supple.  Cardiovascular: Normal rate, regular rhythm. No edema Pulmonary/chest: Effort normal and breath sounds normal. No wheezing, rales or rhonchi. Abdominal: Soft, nondistended. Nontender. Bowel sounds active throughout. There are no masses palpable. No hepatomegaly. Lymphadenopathy: No cervical adenopathy noted. Musculoskeletal: tenderness over lower left anterior ribs cage. No LUQ or RUQ tenderness Neurological: Alert and oriented to person place and time. Skin: Skin is warm and dry. No rashes noted.   ASSESSMENT AND PLAN:  46 yo female with rib cage pain, L > R. She recall a history of costochondritis and current pain feels similar. Pain only related to movement / position. No associated nausea, vomiting, bowel changes, etc.. -trial of naprosyn 220 mg bid x 7 days -take omeprazole once daily while taking naprosyn  -heating pad at night. -avoid yoga or exercises for 7 days.  -call with condition update after one week. I suspect her pain is musculoskeletal but if no improvement may need further workup.    Willette Cluster, NP  11/15/2016, 10:51 AM  Cc:  Lindaann Pascal, PA-C

## 2016-11-16 NOTE — Progress Notes (Signed)
Reviewed and agree with management plan.  Kaleiyah Polsky T. Kento Gossman, MD FACG 

## 2016-11-30 ENCOUNTER — Encounter: Payer: Self-pay | Admitting: Cardiology

## 2016-12-20 ENCOUNTER — Ambulatory Visit: Payer: 59 | Admitting: Cardiology

## 2017-03-12 ENCOUNTER — Ambulatory Visit (INDEPENDENT_AMBULATORY_CARE_PROVIDER_SITE_OTHER): Payer: Self-pay | Admitting: Cardiology

## 2017-03-12 ENCOUNTER — Encounter: Payer: Self-pay | Admitting: Cardiology

## 2017-03-12 VITALS — BP 134/84 | HR 78 | Ht 67.0 in | Wt 160.4 lb

## 2017-03-12 DIAGNOSIS — I5189 Other ill-defined heart diseases: Secondary | ICD-10-CM

## 2017-03-12 DIAGNOSIS — I10 Essential (primary) hypertension: Secondary | ICD-10-CM

## 2017-03-12 DIAGNOSIS — I493 Ventricular premature depolarization: Secondary | ICD-10-CM

## 2017-03-12 DIAGNOSIS — I519 Heart disease, unspecified: Secondary | ICD-10-CM

## 2017-03-12 MED ORDER — AMLODIPINE BESYLATE 5 MG PO TABS: 5.0000 mg | ORAL_TABLET | Freq: Every day | ORAL | 3 refills | Status: DC

## 2017-03-12 NOTE — Progress Notes (Signed)
Patient ID: Emma Barajas, female   DOB: 18-Dec-1970, 47 y.o.   MRN: [id number]      Cardiology Office Note Date:  03/12/2017  Patient ID:  Emma Barajas, DOB [birth year], MRN [id number] PCP:  Marva Panda, NP  Cardiologist: Delton See  Chief Complaint: chest pain, hypertension  History of Present Illness: Katheen Barajas is a 47 y.o. female with history of HTN and PVCs who presents for evaluation of chest pain.  To recap hx, event monitor 04/2013 showed frequent PVCs - total 8160 (4.8% of the time). F/u 2D echo 06/2013: EF 60-65%, grade 2 DD. Labs 10/2014 showed normal CBC, CMET, TSH, LDL 99. She saw Nada Boozer NP 12/23/14 for BP at which time Diovan was added. BB was considered but she was not having current PVCs and there was fear that her energy level would decrease with BB (training for walk). At f/u on 12/29/15 we added additional dose of Diovan in the PM. She eventually required addition of HCTZ per review of phone notes. However, as she has continued to train for her walk (scheduled in March in [place]), she has required less medication.   She discontinued her HCTZ and had to reduce the Diovan back to once a day due to low BPs. She brings a log of her blood pressures today - both AM and PM levels are normotensive. (She does have occasional elevated PM diastolic values but average trend is in the 1teens/70s-80s). She drinks alcohol nightly and says she's been trying to cut back to 1 glass per night. Over the past several weeks she's had episodic chest pain without particular trigger. When it comes on, it tends to last all day. It gets worse when she twists her upper body. It feels like a tightness in her upper chest and throat and is associated with the sensation of the need to take a breath. It is not associated with exertion. She walked 18 miles with her training group on Sunday 03/07/15 without any exertional limitation. There are no alleviating factors.  05/05/2015  - the patient is  coming after 6 months she brings extensive blood pressure diary with most of blood pressures being very well controlled in the range from 92-120 and very few blood pressures in 140 range. She found out that her blood pressure is low in the morning and continues to rise during the day therefore she takes Diovan at noon. She continues going to gym where she pushes herself and she recently walked 50 miles for MS without any symptoms. She has experienced chest pain today that she thinks might be attributed to pushing weights on Monday. She denies any palpitations, no dyspnea on exertion no syncope no lower extremity edema orthopnea.   08/04/2016, the patient is coming after 15 months, she's been experiencing elevated blood pressure for the last 2 weeks, she has been taking valsartan 160 mg daily, when she tried to increase it by another half pill of 80 mg daily she develop nagging cough. In the last 2 days she's been experiencing subjective fever and chills, her cough is nonproductive. In the past she was started on hydrochlorothiazide caused episode of hypotension. She denies any chest pain, she has been feeling profoundly tired in the last couple weeks that is very unusual for her. She denies any dizziness or syncope no lower extremity edema.  Lab work performed on 07/25/2016 shows creatinine 0.5, AST 20, ALT 9, triglycerides 81, HDL 71, LDL 98.  03/12/2017 - this is one year follow-up, in the interim  patient saw our pharmacist she developed cough with valsartan, her medication was changed to Cardizem and later to amlodipine that she is tolerating well, blood pressure controlled and no lower extremity edema. She also denies any chest pain or shortness of breath or palpitation or dizziness no claudications. She is walking daily and exercising yoga.  Past Medical History:  Diagnosis Date  . Essential hypertension   . HGSIL (high grade squamous intraepithelial dysplasia) 2002   LEEP  . IUD 02-23-10    ORIGINALLY INSERTED 01/09/05 AND REPLACE ON 02-23-10  . PVC's (premature ventricular contractions)    a. Event monitor 04/2013 showed frequent PVCs - total 8160 (4.8% of the time). F/u 2D echo 06/2013: EF 60-65%, grade 2 DD.    Past Surgical History:  Procedure Laterality Date  . CERVICAL BIOPSY  W/ LOOP ELECTRODE EXCISION  2002   HGSIL  . INTRAUTERINE DEVICE INSERTION  05/20/2015   Liletta  . LASIX EYE SURGERY  11/2010  . MOUTH SURGERY    . WISDOM TOOTH EXTRACTION      Current Outpatient Medications  Medication Sig Dispense Refill  . B Complex-C (SUPER B COMPLEX PO) Take 1 tablet by mouth daily.     . Calcium Carbonate-Vitamin D (CALCIUM + D PO) Take 1 tablet by mouth daily.     . cetirizine (ZYRTEC ALLERGY) 10 MG tablet Take 10 mg by mouth daily.    . Cholecalciferol (VITAMIN D PO) Take 1 tablet by mouth daily.     . Coenzyme Q10 (CO Q 10 PO) Take 100 mg by mouth daily.     . Levonorgestrel (LILETTA, 52 MG, IU) by Intrauterine route.    Marland Kitchen MAGNESIUM PO Take 125 mg by mouth daily.     . Omega-3 Fatty Acids (FISH OIL PO) Take 1 capsule by mouth daily.     . Probiotic Product (PROBIOTIC DAILY PO) Take 1 tablet by mouth daily.     . valACYclovir (VALTREX) 1000 MG tablet Take 1,000 mg by mouth daily as needed (cold sores).     Marland Kitchen amLODipine (NORVASC) 5 MG tablet Take 1 tablet (5 mg total) by mouth daily. 30 tablet 11   No current facility-administered medications for this visit.     Allergies:   Other   Social History:  The patient  reports that  has never smoked. she has never used smokeless tobacco. She reports that she drinks about 9.0 oz of alcohol per week. She reports that she does not use drugs.   Family History:  The patient's family history includes Diabetes in her father and paternal grandmother; Heart attack in her maternal grandmother; Heart block in her maternal grandmother; Heart disease in her maternal grandmother; Hyperlipidemia in her father and mother; Hypertension in her  brother and father; Prostate cancer in her father.  ROS:  Please see the history of present illness. No evidence of GI bleeding. All other systems are reviewed and otherwise negative.   PHYSICAL EXAM: VS:  BP 134/84   Pulse 78   Ht 5\' 7"  (1.702 m)   Wt 160 lb 6.4 oz (72.8 kg)   BMI 25.12 kg/m   Well nourished, well developed fit appearing WF, in no acute distress  HEENT: normocephalic, atraumatic  Neck: no JVD or masses Cardiac:  normal S1, S2; RRR; no murmurs, rubs, or gallops Lungs:  clear to auscultation bilaterally, no wheezing, rhonchi or rales  Abd: soft, nontender, no hepatomegaly, + BS MS: no deformity or atrophy Ext: no edema  Skin: warm  and dry, no rash Neuro:  moves all extremities spontaneously, no focal abnormalities noted, follows commands Psych: euthymic mood, full affect  EKG:  Done today shows NSR without acute changes  Recent Labs: 11/06/2016: TSH 2.07  No results found for requested labs within last 8760 hours.   CrCl cannot be calculated (Patient's most recent lab result is older than the maximum 21 days allowed.).   Wt Readings from Last 3 Encounters:  03/12/17 160 lb 6.4 oz (72.8 kg)  11/15/16 162 lb (73.5 kg)  11/06/16 162 lb (73.5 kg)    Her EKG performed today 08/05/2015 was personally reviewed and shows normal sinus rhythm, normal EKG and is unchanged from prior.   ASSESSMENT AND PLAN:  1. Chest pain - atypical. She exercised for 13 minutes without angina - ETT negative.She is most probably experiencing musculoskeletal pain. Considering diastolic dysfunction on her echocardiogram will continue treating aggressively for blood pressure. She is exercising daily and has no side effects, will continue the same blood pressure regimen. 2. Essential HTN - with evidence of diastolic dysfunction on her echocardiogram, discontinue Diovan, start Cardizem CD 180 mg daily, this was switched to amlodipine 5 mg that is controlling her blood pressure very well.   3. H/o PVCs - briefly seen during ETT as above, but asymptomatic. Continue surveillance. 4. Lipids followed: In 2017, she is exercising daily and eating healthy diet. No reason to repeat right now.  Signed, Tobias AlexanderKatarina Zanae Kuehnle, MD  03/12/2017 8:35 AM     [facility] [address] [place] [address] [phone] (office)  [phone]) [phone] (fax)

## 2017-03-12 NOTE — Patient Instructions (Signed)

## 2017-11-08 ENCOUNTER — Encounter: Payer: Self-pay | Admitting: Gynecology

## 2017-11-08 ENCOUNTER — Ambulatory Visit (INDEPENDENT_AMBULATORY_CARE_PROVIDER_SITE_OTHER): Payer: PRIVATE HEALTH INSURANCE | Admitting: Gynecology

## 2017-11-08 VITALS — BP 120/78 | Ht 67.0 in | Wt 160.0 lb

## 2017-11-08 DIAGNOSIS — Z30431 Encounter for routine checking of intrauterine contraceptive device: Secondary | ICD-10-CM | POA: Diagnosis not present

## 2017-11-08 DIAGNOSIS — Z01419 Encounter for gynecological examination (general) (routine) without abnormal findings: Secondary | ICD-10-CM

## 2017-11-08 NOTE — Progress Notes (Signed)
    Emma Barajas Aug 27, 1970 [id number]        47 y.o.  G0P0 for annual gynecologic exam.  Without complaints  Past medical history,surgical history, problem list, medications, allergies, family history and social history were all reviewed and documented as reviewed in the EPIC chart.  ROS:  Performed with pertinent positives and negatives included in the history, assessment and plan.   Additional significant findings : None   Exam: Emma Barajas assistant Vitals:   11/08/17 0856  BP: 120/78  Weight: 160 lb (72.6 kg)  Height: 5\' 7"  (1.702 m)   Body mass index is 25.06 kg/m.  General appearance:  Normal affect, orientation and appearance. Skin: Grossly normal HEENT: Without gross lesions.  No cervical or supraclavicular adenopathy. Thyroid normal.  Lungs:  Clear without wheezing, rales or rhonchi Cardiac: RR, without RMG Abdominal:  Soft, nontender, without masses, guarding, rebound, organomegaly or hernia Breasts:  Examined lying and sitting without masses, retractions, discharge or axillary adenopathy. Pelvic:  Ext, BUS, Vagina: Normal  Cervix: Normal.  IUD string visualized using colposcope and endocervical speculum  Uterus: Anteverted, normal size, shape and contour, midline and mobile nontender   Adnexa: Without masses or tenderness    Anus and perineum: Normal   Rectovaginal: Normal sphincter tone without palpated masses or tenderness.    Assessment/Plan:  47 y.o. G0P0 female for annual gynecologic exam without menses, Liletta IUD.   1. IUD 05/2015.  Doing well without menses.  IUD string visualized within the endocervical canal. 2. Mammography due now and I reminded patient to schedule.  Breast exam normal today. 3. Pap smear/HPV 11/2016.  No Pap smear done today.  History of LEEP 2002 with normal Pap smears since. 4. Health maintenance.  No routine lab work done as patient reports this done elsewhere.  Follow-up 1 year, sooner as needed   [name] MD, 9:24  AM 11/08/2017

## 2017-11-08 NOTE — Patient Instructions (Signed)
Follow-up in 1 year for annual exam, sooner if any issues. 

## 2017-11-20 ENCOUNTER — Encounter: Payer: Self-pay | Admitting: Gynecology

## 2018-03-06 ENCOUNTER — Other Ambulatory Visit: Payer: Self-pay | Admitting: Cardiology

## 2018-04-18 ENCOUNTER — Encounter: Payer: Self-pay | Admitting: Physician Assistant

## 2018-04-24 ENCOUNTER — Telehealth: Payer: Self-pay | Admitting: Physician Assistant

## 2018-04-24 MED ORDER — AMLODIPINE BESYLATE 5 MG PO TABS: 5.0000 mg | ORAL_TABLET | Freq: Every day | ORAL | 1 refills | Status: DC

## 2018-04-24 NOTE — Telephone Encounter (Signed)
Called patient concerning her yearly follow-up appointment 04/30/2018.  She is not having any problems and is happy to reschedule due to the recent coronavirus.  She does need amlodipine refilled 90-day supply.  She plans on hiking in [place] at the end of May.  We will reschedule her with Dr. Delton See or myself for 6 months.

## 2018-04-24 NOTE — Addendum Note (Signed)
Addended by: Daleen Bo I on: 04/24/2018 02:33 PM   Modules accepted: Orders

## 2018-04-24 NOTE — Telephone Encounter (Signed)
Refill for amlodipine sent in. Recall placed for 6 months.

## 2018-04-25 MED ORDER — AMLODIPINE BESYLATE 5 MG PO TABS: 5.0000 mg | ORAL_TABLET | Freq: Every day | ORAL | 1 refills | Status: DC

## 2018-04-25 NOTE — Addendum Note (Signed)
Addended by: Jacqlyn Krauss on: 04/25/2018 11:30 AM   Modules accepted: Orders

## 2018-04-30 ENCOUNTER — Ambulatory Visit: Payer: 59 | Admitting: Physician Assistant

## 2018-10-24 ENCOUNTER — Encounter: Payer: Self-pay | Admitting: Cardiology

## 2018-10-24 ENCOUNTER — Other Ambulatory Visit: Payer: Self-pay

## 2018-10-24 ENCOUNTER — Ambulatory Visit (INDEPENDENT_AMBULATORY_CARE_PROVIDER_SITE_OTHER): Payer: PRIVATE HEALTH INSURANCE | Admitting: Cardiology

## 2018-10-24 VITALS — BP 120/70 | HR 69 | Ht 67.0 in | Wt 160.2 lb

## 2018-10-24 DIAGNOSIS — I10 Essential (primary) hypertension: Secondary | ICD-10-CM | POA: Diagnosis not present

## 2018-10-24 DIAGNOSIS — I493 Ventricular premature depolarization: Secondary | ICD-10-CM | POA: Diagnosis not present

## 2018-10-24 NOTE — Patient Instructions (Signed)
Medication Instructions:   Your physician recommends that you continue on your current medications as directed. Please refer to the Current Medication list given to you today.  If you need a refill on your cardiac medications before your next appointment, please call your pharmacy.     Follow-Up: At [facility], you and your health needs are our priority.  As part of our continuing mission to provide you with exceptional heart care, we have created designated Provider Care Teams.  These Care Teams include your primary Cardiologist (physician) and Advanced Practice Providers (APPs -  Physician Assistants and Nurse Practitioners) who all work together to provide you with the care you need, when you need it. You will need a follow up appointment in 12 months with Dr Nelson.  Please call our office 2 months in advance to schedule this appointment.  You may see Dr. Nelson or one of the following Advanced Practice Providers on your designated Care Team:   Brittainy Simmons, PA-C Dayna Dunn, PA-C . Michele Lenze, PA-C     

## 2018-10-24 NOTE — Progress Notes (Signed)
Patient ID: Emma Barajas, female   DOB: May 18, 1970, 48 y.o.   MRN: [id number]      Cardiology Office Note Date:  10/24/2018  Patient ID:  Emma Barajas, DOB 1970-11-10, MRN [id number] PCP:  Marva Panda, NP  Cardiologist: Delton See  Chief Complaint: chest pain, hypertension  History of Present Illness: Emma Barajas is a 48 y.o. female with history of HTN and PVCs who presents for evaluation of chest pain.  To recap hx, event monitor 04/2013 showed frequent PVCs - total 8160 (4.8% of the time). F/u 2D echo 06/2013: EF 60-65%, grade 2 DD. Labs 10/2014 showed normal CBC, CMET, TSH, LDL 99. She saw Nada Boozer NP 12/23/14 for BP at which time Diovan was added. BB was considered but she was not having current PVCs and there was fear that her energy level would decrease with BB (training for walk). At f/u on 12/29/15 we added additional dose of Diovan in the PM. She eventually required addition of HCTZ per review of phone notes. However, as she has continued to train for her walk (scheduled in March in [place]), she has required less medication.   She discontinued her HCTZ and had to reduce the Diovan back to once a day due to low BPs. She brings a log of her blood pressures today - both AM and PM levels are normotensive. (She does have occasional elevated PM diastolic values but average trend is in the 1teens/70s-80s). She drinks alcohol nightly and says she's been trying to cut back to 1 glass per night. Over the past several weeks she's had episodic chest pain without particular trigger. When it comes on, it tends to last all day. It gets worse when she twists her upper body. It feels like a tightness in her upper chest and throat and is associated with the sensation of the need to take a breath. It is not associated with exertion. She walked 18 miles with her training group on Sunday 03/07/15 without any exertional limitation. There are no alleviating factors.  05/05/2015  - the patient is  coming after 6 months she brings extensive blood pressure diary with most of blood pressures being very well controlled in the range from 92-120 and very few blood pressures in 140 range. She found out that her blood pressure is low in the morning and continues to rise during the day therefore she takes Diovan at noon. She continues going to gym where she pushes herself and she recently walked 50 miles for [place] without any symptoms. She has experienced chest pain today that she thinks might be attributed to pushing weights on Monday. She denies any palpitations, no dyspnea on exertion no syncope no lower extremity edema orthopnea.   08/04/2016, the patient is coming after 15 months, she's been experiencing elevated blood pressure for the last 2 weeks, she has been taking valsartan 160 mg daily, when she tried to increase it by another half pill of 80 mg daily she develop nagging cough. In the last 2 days she's been experiencing subjective fever and chills, her cough is nonproductive. In the past she was started on hydrochlorothiazide caused episode of hypotension. She denies any chest pain, she has been feeling profoundly tired in the last couple weeks that is very unusual for her. She denies any dizziness or syncope no lower extremity edema.  Lab work performed on 07/25/2016 shows creatinine 0.5, AST 20, ALT 9, triglycerides 81, HDL 71, LDL 98.  03/12/2017 - this is one year follow-up, in the interim  patient saw our pharmacist she developed cough with valsartan, her medication was changed to Cardizem and later to amlodipine that she is tolerating well, blood pressure controlled and no lower extremity edema. She also denies any chest pain or shortness of breath or palpitation or dizziness no claudications. She is walking daily and exercising yoga.  10/24/2018 -the patient is coming after 6 months, she denies any chest pain shortness of breath palpitation or lower extremity edema.  Her blood pressure has been  well controlled.  She continues to walk 5-1/2 miles a day.  To achieve 2000 miles in a year.  She has been dealing with flank pain and recurrent UTIs.  Her father was recently diagnosed with stroke and atrial fibrillation.  Past Medical History:  Diagnosis Date  . Essential hypertension   . HGSIL (high grade squamous intraepithelial dysplasia) 2002   LEEP  . IUD 02-23-10   ORIGINALLY INSERTED 01/09/05 AND REPLACE ON 02-23-10  . PVC's (premature ventricular contractions)    a. Event monitor 04/2013 showed frequent PVCs - total 8160 (4.8% of the time). F/u 2D echo 06/2013: EF 60-65%, grade 2 DD.    Past Surgical History:  Procedure Laterality Date  . CERVICAL BIOPSY  W/ LOOP ELECTRODE EXCISION  2002   HGSIL  . INTRAUTERINE DEVICE INSERTION  05/20/2015   Liletta  . LASIX EYE SURGERY  11/2010  . MOUTH SURGERY    . WISDOM TOOTH EXTRACTION      Current Outpatient Medications  Medication Sig Dispense Refill  . amLODipine (NORVASC) 5 MG tablet Take 1 tablet (5 mg total) by mouth daily. 90 tablet 1  . B Complex-C (SUPER B COMPLEX PO) Take 1 tablet by mouth daily.     . Calcium Carbonate-Vitamin D (CALCIUM + D PO) Take 1 tablet by mouth daily.     . cetirizine (ZYRTEC ALLERGY) 10 MG tablet Take 10 mg by mouth daily.    . Cholecalciferol (VITAMIN D PO) Take 1 tablet by mouth daily.     . Coenzyme Q10 (CO Q 10 PO) Take 100 mg by mouth daily.     . Levonorgestrel (LILETTA, 52 MG, IU) by Intrauterine route.    Marland Kitchen. MAGNESIUM PO Take 125 mg by mouth daily.     . Omega-3 Fatty Acids (FISH OIL PO) Take 1 capsule by mouth daily.     . Probiotic Product (PROBIOTIC DAILY PO) Take 1 tablet by mouth daily.     Marland Kitchen. terbinafine (LAMISIL) 250 MG tablet Take 250 mg by mouth daily.    . valACYclovir (VALTREX) 1000 MG tablet Take 1,000 mg by mouth daily as needed (cold sores).      No current facility-administered medications for this visit.     Allergies:   Other   Social History:  The patient  reports that  she has never smoked. She has never used smokeless tobacco. She reports current alcohol use of about 15.0 standard drinks of alcohol per week. She reports that she does not use drugs.   Family History:  The patient's family history includes Atrial fibrillation in her father; Diabetes in her father and paternal grandmother; Heart attack in her maternal grandmother; Heart block in her maternal grandmother and mother; Heart disease in her maternal grandmother; Hyperlipidemia in her father and mother; Hypertension in her brother and father; Prostate cancer in her father; Stroke in her father.  ROS:  Please see the history of present illness. No evidence of GI bleeding. All other systems are reviewed and otherwise negative.  PHYSICAL EXAM: VS:  BP 120/70   Pulse 69   Ht 5\' 7"  (1.702 m)   Wt 160 lb 3.2 oz (72.7 kg)   SpO2 99%   BMI 25.09 kg/m   Well nourished, well developed fit appearing WF, in no acute distress  HEENT: normocephalic, atraumatic  Neck: no JVD or masses Cardiac:  normal S1, S2; RRR; no murmurs, rubs, or gallops Lungs:  clear to auscultation bilaterally, no wheezing, rhonchi or rales  Abd: soft, nontender, no hepatomegaly, + BS MS: no deformity or atrophy Ext: no edema  Skin: warm and dry, no rash Neuro:  moves all extremities spontaneously, no focal abnormalities noted, follows commands Psych: euthymic mood, full affect  EKG:  Done today shows NSR without acute changes  Recent Labs: No results found for requested labs within last 8760 hours.  No results found for requested labs within last 8760 hours.   CrCl cannot be calculated (Patient's most recent lab result is older than the maximum 21 days allowed.).   Wt Readings from Last 3 Encounters:  10/24/18 160 lb 3.2 oz (72.7 kg)  11/08/17 160 lb (72.6 kg)  03/12/17 160 lb 6.4 oz (72.8 kg)    Her EKG performed today shows normal sinus rhythm with a heart rate 69 bpm, normal EKG this is unchanged from prior.  This was  personally reviewed.   ASSESSMENT AND PLAN:  1. Essential HTN - with evidence of diastolic dysfunction on her echocardiogram, discontinue Diovan, start Cardizem CD 180 mg daily, this was switched to amlodipine 5 mg that is controlling her blood pressure very well.  She has no signs of orthostatic hypotension. 2. H/o PVCs - briefly seen during ETT as above, but asymptomatic. Continue surveillance. 3. Lipids followed: In 2017, she is exercising daily and eating healthy diet. No reason to repeat right now.  4.  Flank pain and recurrent UTI, she is advised to follow-up with her primary care physician to rule out pyelonephritis.  Signed, Ena Dawley, MD  10/24/2018 3:24 PM     [place] [address] [phone] (office)  [phone] (fax)

## 2018-10-30 ENCOUNTER — Encounter: Payer: Self-pay | Admitting: Gynecology

## 2018-11-14 ENCOUNTER — Encounter: Payer: Self-pay | Admitting: Gynecology

## 2018-11-14 ENCOUNTER — Ambulatory Visit (INDEPENDENT_AMBULATORY_CARE_PROVIDER_SITE_OTHER): Payer: PRIVATE HEALTH INSURANCE | Admitting: Gynecology

## 2018-11-14 ENCOUNTER — Other Ambulatory Visit: Payer: Self-pay

## 2018-11-14 VITALS — BP 124/82 | Ht 67.0 in | Wt 155.0 lb

## 2018-11-14 DIAGNOSIS — Z01419 Encounter for gynecological examination (general) (routine) without abnormal findings: Secondary | ICD-10-CM | POA: Diagnosis not present

## 2018-11-14 DIAGNOSIS — Z30431 Encounter for routine checking of intrauterine contraceptive device: Secondary | ICD-10-CM

## 2018-11-14 DIAGNOSIS — N951 Menopausal and female climacteric states: Secondary | ICD-10-CM

## 2018-11-14 NOTE — Progress Notes (Signed)
    Emma Barajas 10-22-1970 [id number]        48 y.o.  G0P0 for annual gynecologic exam.  Notes having some hot flashes at night while sleeping.  Not having sweats during the day.  Remains amenorrheic with her IUD.  Past medical history,surgical history, problem list, medications, allergies, family history and social history were all reviewed and documented as reviewed in the EPIC chart.  ROS:  Performed with pertinent positives and negatives included in the history, assessment and plan.   Additional significant findings : None   Exam: Caryn Bee assistant Vitals:   11/14/18 0756  BP: 124/82  Weight: 155 lb (70.3 kg)  Height: 5\' 7"  (1.702 m)   Body mass index is 24.28 kg/m.  General appearance:  Normal affect, orientation and appearance. Skin: Grossly normal HEENT: Without gross lesions.  No cervical or supraclavicular adenopathy. Thyroid normal.  Lungs:  Clear without wheezing, rales or rhonchi Cardiac: RR, without RMG Abdominal:  Soft, nontender, without masses, guarding, rebound, organomegaly or hernia Breasts:  Examined lying and sitting without masses, retractions, discharge or axillary adenopathy. Pelvic:  Ext, BUS, Vagina: Normal  Cervix: Normal.  IUD string visualized  Uterus: Anteverted, normal size, shape and contour, midline and mobile nontender   Adnexa: Without masses or tenderness    Anus and perineum: Normal   Rectovaginal: Normal sphincter tone without palpated masses or tenderness.    Assessment/Plan:  48 y.o. G0P0 female for annual gynecologic exam.  Without menses, Liletta IUD  1. Liletta IUD 05/2015.  We reviewed latest literature showing Liletta good for 6 years.  She is doing well with this. 2. Hot flushes at night.  No symptoms during the day.  Will check baseline FSH.  TSH normal at her recent blood work through her primary. 3. Mammography due now and I reminded patient to schedule.  Breast exam normal today. 4. Pap smear/HPV 2018.  History of LEEP  2002.  Normal Pap smears since.  Plan follow-up Pap smear/HPV at 5-year interval per current screening guidelines. 5. Health maintenance.  No routine lab work done as patient does this elsewhere.  Follow-up 1 year, sooner as needed.   Anastasio Auerbach MD, 8:24 AM 11/14/2018

## 2018-11-14 NOTE — Patient Instructions (Signed)
Office will let you know the hormone test results.  Follow-up in 1 year for annual exam

## 2018-11-15 ENCOUNTER — Encounter: Payer: Self-pay | Admitting: Gynecology

## 2018-11-15 LAB — FOLLICLE STIMULATING HORMONE: FSH: 49.9 m[IU]/mL

## 2018-12-20 ENCOUNTER — Other Ambulatory Visit: Payer: Self-pay | Admitting: Physician Assistant

## 2019-01-27 ENCOUNTER — Ambulatory Visit: Payer: PRIVATE HEALTH INSURANCE | Attending: Internal Medicine

## 2019-01-27 DIAGNOSIS — Z20822 Contact with and (suspected) exposure to covid-19: Secondary | ICD-10-CM

## 2019-01-28 LAB — NOVEL CORONAVIRUS, NAA: SARS-CoV-2, NAA: DETECTED — AB

## 2019-01-29 ENCOUNTER — Ambulatory Visit: Payer: Self-pay

## 2019-01-29 NOTE — Telephone Encounter (Signed)
Patient states that she saw her results on My chart .  Reviewed Isolation  Protocol on Provided care advice.  Patient voiced understanding.  Denies any questions.

## 2019-02-20 ENCOUNTER — Telehealth: Payer: Self-pay | Admitting: *Deleted

## 2019-02-20 NOTE — Telephone Encounter (Addendum)
Dr. Audie Box patient) will transfer care with you. Annual scheduled on 11/18/19. Patient has noticed x2 months of bone and joint pain, hand joint stiffness and lower back discomfort. Heel pain worsen at night, typically walks 5-6 miles, now walks about and heels are very painful. Has seen chiropractor & massage therapist and no abnormality. Patient has history of taking depo-provera years ago, patient said that she had a Dexa scan done years ago (maybe 2012 or earlier) due to using depo-provera for years ordered by Dr. Audie Box. Patient aware paper chart is at off site location. Currently using Liletta IUD and doing well, FSH elevated. Patient would like recommendations? OV with you to exam? Please advise

## 2019-02-20 NOTE — Telephone Encounter (Signed)
Message left

## 2019-02-21 NOTE — Telephone Encounter (Signed)
Message left

## 2019-02-25 NOTE — Telephone Encounter (Signed)
Telephone call, extreme heel pain mostly been night but is unable to walk her dogs long walks like she has been for many years and also is having hand stiffness and joint pain in her hands.  Continue as she is doing with shoe inserts, increasing rest, and follow-up with an orthopedic doctor to evaluate.  FSH elevated, reviewed most likely not osteoporosis but could have a DEXA, encouraged her to call insurance and if covered to schedule here at our office.

## 2019-10-09 ENCOUNTER — Other Ambulatory Visit: Payer: Self-pay | Admitting: Physician Assistant

## 2019-11-11 ENCOUNTER — Encounter: Payer: Self-pay | Admitting: Cardiology

## 2019-11-11 ENCOUNTER — Ambulatory Visit (INDEPENDENT_AMBULATORY_CARE_PROVIDER_SITE_OTHER): Payer: PRIVATE HEALTH INSURANCE | Admitting: Cardiology

## 2019-11-11 ENCOUNTER — Other Ambulatory Visit: Payer: Self-pay

## 2019-11-11 VITALS — BP 124/82 | HR 83 | Ht 67.0 in | Wt 161.0 lb

## 2019-11-11 DIAGNOSIS — Z8249 Family history of ischemic heart disease and other diseases of the circulatory system: Secondary | ICD-10-CM | POA: Diagnosis not present

## 2019-11-11 DIAGNOSIS — E785 Hyperlipidemia, unspecified: Secondary | ICD-10-CM | POA: Diagnosis not present

## 2019-11-11 DIAGNOSIS — I493 Ventricular premature depolarization: Secondary | ICD-10-CM

## 2019-11-11 DIAGNOSIS — I10 Essential (primary) hypertension: Secondary | ICD-10-CM | POA: Diagnosis not present

## 2019-11-11 NOTE — Patient Instructions (Signed)
Medication Instructions:   Your physician recommends that you continue on your current medications as directed. Please refer to the Current Medication list given to you today.   *If you need a refill on your cardiac medications before your next appointment, please call your pharmacy*   Lab Work:  PLEASE HAVE YOUR LABS FAXED TO OUR OFFICE AT [phone] ATTENTION: DR. Delton See  If you have labs (blood work) drawn today and your tests are completely normal, you will receive your results only by: Marland Kitchen MyChart Message (if you have MyChart) OR . A paper copy in the mail If you have any lab test that is abnormal or we need to change your treatment, we will call you to review the results.  Testing/Procedures:  CARDIAC CALCIUM SCORE TO BE DONE HERE IN THE OFFICE TODAY IF POSSIBLE PER DR. Delton See  Follow-Up: At [facility], you and your health needs are our priority.  As part of our continuing mission to provide you with exceptional heart care, we have created designated Provider Care Teams.  These Care Teams include your primary Cardiologist (physician) and Advanced Practice Providers (APPs -  Physician Assistants and Nurse Practitioners) who all work together to provide you with the care you need, when you need it.  We recommend signing up for the patient portal called "MyChart".  Sign up information is provided on this After Visit Summary.  MyChart is used to connect with patients for Virtual Visits (Telemedicine).  Patients are able to view lab/test results, encounter notes, upcoming appointments, etc.  Non-urgent messages can be sent to your provider as well.   To learn more about what you can do with MyChart, go to [email or web address].    Your next appointment:   12 month(s)  The format for your next appointment:   In Person  Provider:   Tobias Alexander, MD

## 2019-11-11 NOTE — Progress Notes (Addendum)
Patient ID: Emma Barajas, female   DOB: 11/28/1970, 49 y.o.   MRN: [id number]      Cardiology Office Note Date:  11/11/2019  Patient ID:  Emma Barajas, DOB 1970/03/23, MRN [id number] PCP:  Marva Panda, NP  Cardiologist: Delton See  Reason for visit: 1 year follow-up  History of Present Illness: Emma Barajas is a 49 y.o. female with history of HTN and PVCs who presents for evaluation of chest pain.  To recap hx, event monitor 04/2013 showed frequent PVCs - total 8160 (4.8% of the time). F/u 2D echo 06/2013: EF 60-65%, grade 2 DD. Labs 10/2014 showed normal CBC, CMET, TSH, LDL 99. She saw Nada Boozer NP 12/23/14 for BP at which time Diovan was added. BB was considered but she was not having current PVCs and there was fear that her energy level would decrease with BB (training for walk). At f/u on 12/29/15 we added additional dose of Diovan in the PM. She eventually required addition of HCTZ per review of phone notes. However, as she has continued to train for her walk (scheduled in March in [place]), she has required less medication.   The patient is coming after a year, she has been doing great, she walks 5 miles a day every day and swims twice a week.  She has no symptoms of chest pain shortness of breath.  No recent palpitation dizziness or syncope.  She feels great.  She is worried because history of her relatives underwent coronary intervention with stenting in the last year, including her mother and were in their 79s and her cousin who is 48 years old.  Past Medical History:  Diagnosis Date  . Essential hypertension   . HGSIL (high grade squamous intraepithelial dysplasia) 2002   LEEP  . IUD 02-23-10   ORIGINALLY INSERTED 01/09/05 AND REPLACE ON 02-23-10  . PVC's (premature ventricular contractions)    a. Event monitor 04/2013 showed frequent PVCs - total 8160 (4.8% of the time). F/u 2D echo 06/2013: EF 60-65%, grade 2 DD.    Past Surgical History:  Procedure Laterality Date  .  CERVICAL BIOPSY  W/ LOOP ELECTRODE EXCISION  2002   HGSIL  . INTRAUTERINE DEVICE INSERTION  05/20/2015   Liletta  . LASIX EYE SURGERY  11/2010  . MOUTH SURGERY    . WISDOM TOOTH EXTRACTION      Current Outpatient Medications  Medication Sig Dispense Refill  . amLODipine (NORVASC) 5 MG tablet Take 1 tablet by mouth once daily 30 tablet 0  . B Complex-C (SUPER B COMPLEX PO) Take 1 tablet by mouth daily.     . Calcium Carbonate-Vitamin D (CALCIUM + D PO) Take 1 tablet by mouth daily.     . cetirizine (ZYRTEC ALLERGY) 10 MG tablet Take 10 mg by mouth daily.    . Cholecalciferol (VITAMIN D PO) Take 1 tablet by mouth daily.     . Coenzyme Q10 (CO Q 10 PO) Take 100 mg by mouth daily.     . Levonorgestrel (LILETTA, 52 MG, IU) by Intrauterine route.    Marland Kitchen MAGNESIUM PO Take 125 mg by mouth daily.     . Omega-3 Fatty Acids (FISH OIL PO) Take 1 capsule by mouth daily.     . Probiotic Product (PROBIOTIC DAILY PO) Take 1 tablet by mouth daily.     Marland Kitchen terbinafine (LAMISIL) 250 MG tablet Take 250 mg by mouth daily.    . valACYclovir (VALTREX) 1000 MG tablet Take 1,000 mg by mouth daily as  needed (cold sores).      No current facility-administered medications for this visit.    Allergies:   Other   Social History:  The patient  reports that she has never smoked. She has never used smokeless tobacco. She reports current alcohol use of about 15.0 standard drinks of alcohol per week. She reports that she does not use drugs.   Family History:  The patient's family history includes Atrial fibrillation in her father; Diabetes in her father and paternal grandmother; Heart attack in her maternal grandmother; Heart block in her maternal grandmother and mother; Heart disease in her maternal grandmother; Hyperlipidemia in her father and mother; Hypertension in her brother and father; Prostate cancer in her father; Stroke in her father.  ROS:  Please see the history of present illness. No evidence of GI  bleeding. All other systems are reviewed and otherwise negative.   PHYSICAL EXAM: VS:  BP 124/82   Pulse 83   Ht 5\' 7"  (1.702 m)   Wt 161 lb (73 kg)   SpO2 99%   BMI 25.22 kg/m   Well nourished, well developed fit appearing WF, in no acute distress  HEENT: normocephalic, atraumatic  Neck: no JVD or masses Cardiac:  normal S1, S2; RRR; no murmurs, rubs, or gallops Lungs:  clear to auscultation bilaterally, no wheezing, rhonchi or rales  Abd: soft, nontender, no hepatomegaly, + BS MS: no deformity or atrophy Ext: no edema  Skin: warm and dry, no rash Neuro:  moves all extremities spontaneously, no focal abnormalities noted, follows commands Psych: euthymic mood, full affect  EKG:  Done today shows NSR without acute changes  Recent Labs: No results found for requested labs within last 8760 hours.  No results found for requested labs within last 8760 hours.   CrCl cannot be calculated (Patient's most recent lab result is older than the maximum 21 days allowed.).   Wt Readings from Last 3 Encounters:  11/11/19 161 lb (73 kg)  11/14/18 155 lb (70.3 kg)  10/24/18 160 lb 3.2 oz (72.7 kg)    Her EKG performed today shows normal sinus rhythm with a heart rate 83 bpm, normal EKG, unchanged from prior.  ASSESSMENT AND PLAN:  1. Essential HTN -currently at goal, will continue current management.   2. H/o PVCs - briefly seen during ETT as above, but asymptomatic. Continue surveillance. 3. Family history of premature CAD, the patient is asymptomatic, 1 year after menopause, we will obtain calcium scoring, and to get her with lipid profile from her PCP that she will obtain next week we will consider starting lipid-lowering agent.  Based on her lipids next week we will calculate her overall 10-year CVD risk score. 4. Lipids, in 2018 HDL 71, LDL 98, triglycerides 81, she is going to fax his new results next week.  Signed, 2019, MD  11/11/2019 9:22 AM     [facility] [address] [place] [phone] (office)  [phone] (fax)

## 2019-11-14 ENCOUNTER — Other Ambulatory Visit: Payer: Self-pay | Admitting: Physician Assistant

## 2019-11-14 NOTE — Telephone Encounter (Signed)
*  STAT* If patient is at the pharmacy, call can be transferred to refill team.   1. Which medications need to be refilled? (please list name of each medication and dose if known) Amlodipine  2. Which pharmacy/location (including street and city if local pharmacy) is medication to be sent to? [facility], [place]  3. Do they need a 30 day or 90 day supply? 90 days and refills

## 2019-11-18 ENCOUNTER — Ambulatory Visit (INDEPENDENT_AMBULATORY_CARE_PROVIDER_SITE_OTHER): Payer: PRIVATE HEALTH INSURANCE | Admitting: Nurse Practitioner

## 2019-11-18 ENCOUNTER — Other Ambulatory Visit: Payer: Self-pay

## 2019-11-18 ENCOUNTER — Encounter: Payer: Self-pay | Admitting: Nurse Practitioner

## 2019-11-18 VITALS — BP 130/80 | Ht 67.0 in | Wt 162.2 lb

## 2019-11-18 DIAGNOSIS — M545 Low back pain, unspecified: Secondary | ICD-10-CM

## 2019-11-18 DIAGNOSIS — Z01419 Encounter for gynecological examination (general) (routine) without abnormal findings: Secondary | ICD-10-CM

## 2019-11-18 DIAGNOSIS — Z30431 Encounter for routine checking of intrauterine contraceptive device: Secondary | ICD-10-CM

## 2019-11-18 DIAGNOSIS — Z23 Encounter for immunization: Secondary | ICD-10-CM

## 2019-11-18 DIAGNOSIS — R232 Flushing: Secondary | ICD-10-CM

## 2019-11-18 DIAGNOSIS — G8929 Other chronic pain: Secondary | ICD-10-CM

## 2019-11-18 LAB — FOLLICLE STIMULATING HORMONE: FSH: 50 m[IU]/mL

## 2019-11-18 NOTE — Progress Notes (Signed)
   Emma Barajas Aug 24, 1970 [id number]   History:  49 y.o. G0 presents for annual exam. 2002 LEEP, subsequent paps normal. Normal mammogram history. Amenorrheic, Liletta IUD inserted 05/2015. Complains of hot flashes, night sweats, and joint pain mostly in her back. The pain is worse in upon waking and after exercise. 11/2018 FSH was 49.9. Victoriano Lain for 1 month but stopped because she was not sure if IUD had estrogen in it. She noticed improvement while she was taking it. Has not had screening colonoscopy.   Gynecologic History No LMP recorded. (Menstrual status: IUD).   Contraception: IUD Last Pap: 11/06/2016. Results were: normal Last mammogram: 12/06/2018. Results were: normal Last colonoscopy: Never   Past medical history, past surgical history, family history and social history were all reviewed and documented in the EPIC chart.  ROS:  A ROS was performed and pertinent positives and negatives are included.  Exam:  Vitals:   11/18/19 0807  BP: 130/80  Weight: 162 lb 3.2 oz (73.6 kg)  Height: 5\' 7"  (1.702 m)   Body mass index is 25.4 kg/m.  General appearance:  Normal Thyroid:  Symmetrical, normal in size, without palpable masses or nodularity. Respiratory  Auscultation:  Clear without wheezing or rhonchi Cardiovascular  Auscultation:  Regular rate, without rubs, murmurs or gallops  Edema/varicosities:  Not grossly evident Abdominal  Soft,nontender, without masses, guarding or rebound.  Liver/spleen:  No organomegaly noted  Hernia:  None appreciated  Skin  Inspection:  Grossly normal   Breasts: Examined lying and sitting.   Right: Without masses, retractions, discharge or axillary adenopathy.   Left: Without masses, retractions, discharge or axillary adenopathy. Gentitourinary   Inguinal/mons:  Normal without inguinal adenopathy  External genitalia:  Normal  BUS/Urethra/Skene's glands:  Normal  Vagina:  Atrophic changes  Cervix:  Normal, IUD string visible in  os  Uterus:  Normal in size, shape and contour.  Midline and mobile  Adnexa/parametria:     Rt: Without masses or tenderness.   Lt: Without masses or tenderness.  Anus and perineum: Normal  Digital rectal exam: Normal sphincter tone without palpated masses or tenderness  Assessment/Plan:  49 y.o. G0 for annual exam.   Well female exam with routine gynecological exam - Education provided on SBEs, importance of preventative screenings, current guidelines, high calcium diet, regular exercise, and multivitamin daily. Labs with PCP.   Screening for cervical cancer -2002 LEEP, subsequent Paps normal.  Will repeat at 5-year interval.  Screening for breast cancer -normal mammogram history.  Normal breast exam today.  Encounter for routine checking of intrauterine contraceptive device (IUD) -Liletta April 2017.  IUD in correct position and strings visible in cervical os.  She had questions about when she should remove and we discussed removing next year to make sure she is postmenopausal.  Hot flashes - Plan: Follicle stimulating hormone.  FSH 49.9 one year ago.  We will recheck today since she is amenorrheic due to IUD.  Recommend restarting Estroven for management.   Chronic midline low back pain without sciatica -pain sounds arthritic in nature.  It is worse upon waking and after exercise.  Recommend seeing PCP to discuss possible x-ray.  Tylenol and/or ibuprofen as needed for pain.  Flu vaccine administered in office today.  Follow-up in 1 year for annual.      May 2017 [facility], 8:25 AM 11/18/2019

## 2019-11-18 NOTE — Patient Instructions (Addendum)
Health Maintenance, Female Adopting a healthy lifestyle and getting preventive care are important in promoting health and wellness. Ask your health care provider about:  The right schedule for you to have regular tests and exams.  Things you can do on your own to prevent diseases and keep yourself healthy. What should I know about diet, weight, and exercise? Eat a healthy diet   Eat a diet that includes plenty of vegetables, fruits, low-fat dairy products, and lean protein.  Do not eat a lot of foods that are high in solid fats, added sugars, or sodium. Maintain a healthy weight Body mass index (BMI) is used to identify weight problems. It estimates body fat based on height and weight. Your health care provider can help determine your BMI and help you achieve or maintain a healthy weight. Get regular exercise Get regular exercise. This is one of the most important things you can do for your health. Most adults should:  Exercise for at least 150 minutes each week. The exercise should increase your heart rate and make you sweat (moderate-intensity exercise).  Do strengthening exercises at least twice a week. This is in addition to the moderate-intensity exercise.  Spend less time sitting. Even light physical activity can be beneficial. Watch cholesterol and blood lipids Have your blood tested for lipids and cholesterol at 50 years of age, then have this test every 5 years. Have your cholesterol levels checked more often if:  Your lipid or cholesterol levels are high.  You are older than 50 years of age.  You are at high risk for heart disease. What should I know about cancer screening? Depending on your health history and family history, you may need to have cancer screening at various ages. This may include screening for:  Breast cancer.  Cervical cancer.  Colorectal cancer.  Skin cancer.  Lung cancer. What should I know about heart disease, diabetes, and high blood  pressure? Blood pressure and heart disease  High blood pressure causes heart disease and increases the risk of stroke. This is more likely to develop in people who have high blood pressure readings, are of African descent, or are overweight.  Have your blood pressure checked: ? Every 3-5 years if you are 18-39 years of age. ? Every year if you are 40 years old or older. Diabetes Have regular diabetes screenings. This checks your fasting blood sugar level. Have the screening done:  Once every three years after age 40 if you are at a normal weight and have a low risk for diabetes.  More often and at a younger age if you are overweight or have a high risk for diabetes. What should I know about preventing infection? Hepatitis B If you have a higher risk for hepatitis B, you should be screened for this virus. Talk with your health care provider to find out if you are at risk for hepatitis B infection. Hepatitis C Testing is recommended for:  Everyone born from 1945 through 1965.  Anyone with known risk factors for hepatitis C. Sexually transmitted infections (STIs)  Get screened for STIs, including gonorrhea and chlamydia, if: ? You are sexually active and are younger than 50 years of age. ? You are older than 50 years of age and your health care provider tells you that you are at risk for this type of infection. ? Your sexual activity has changed since you were last screened, and you are at increased risk for chlamydia or gonorrhea. Ask your health care provider if   you are at risk.  Ask your health care provider about whether you are at high risk for HIV. Your health care provider may recommend a prescription medicine to help prevent HIV infection. If you choose to take medicine to prevent HIV, you should first get tested for HIV. You should then be tested every 3 months for as long as you are taking the medicine. Pregnancy  If you are about to stop having your period (premenopausal) and  you may become pregnant, seek counseling before you get pregnant.  Take 400 to 800 micrograms (mcg) of folic acid every day if you become pregnant.  Ask for birth control (contraception) if you want to prevent pregnancy. Osteoporosis and menopause Osteoporosis is a disease in which the bones lose minerals and strength with aging. This can result in bone fractures. If you are 65 years old or older, or if you are at risk for osteoporosis and fractures, ask your health care provider if you should:  Be screened for bone loss.  Take a calcium or vitamin D supplement to lower your risk of fractures.  Be given hormone replacement therapy (HRT) to treat symptoms of menopause. Follow these instructions at home: Lifestyle  Do not use any products that contain nicotine or tobacco, such as cigarettes, e-cigarettes, and chewing tobacco. If you need help quitting, ask your health care provider.  Do not use street drugs.  Do not share needles.  Ask your health care provider for help if you need support or information about quitting drugs. Alcohol use  Do not drink alcohol if: ? Your health care provider tells you not to drink. ? You are pregnant, may be pregnant, or are planning to become pregnant.  If you drink alcohol: ? Limit how much you use to 0-1 drink a day. ? Limit intake if you are breastfeeding.  Be aware of how much alcohol is in your drink. In the [place] one drink equals one 12 oz bottle of beer (355 mL), one 5 oz glass of wine (148 mL), or one 1 oz glass of hard liquor (44 mL). General instructions  Schedule regular health, dental, and eye exams.  Stay current with your vaccines.  Tell your health care provider if: ? You often feel depressed. ? You have ever been abused or do not feel safe at home. Summary  Adopting a healthy lifestyle and getting preventive care are important in promoting health and wellness.  Follow your health care provider's instructions about healthy  diet, exercising, and getting tested or screened for diseases.  Follow your health care provider's instructions on monitoring your cholesterol and blood pressure. This information is not intended to replace advice given to you by your health care provider. Make sure you discuss any questions you have with your health care provider. Document Revised: 01/16/2018 Document Reviewed: 01/16/2018 Elsevier Patient Education  2020 Elsevier Inc.  Menopause Menopause is the normal time of life when menstrual periods stop completely. It is usually confirmed by 12 months without a menstrual period. The transition to menopause (perimenopause) most often happens between the ages of 45 and 55. During perimenopause, hormone levels change in your body, which can cause symptoms and affect your health. Menopause may increase your risk for:  Loss of bone (osteoporosis), which causes bone breaks (fractures).  Depression.  Hardening and narrowing of the arteries (atherosclerosis), which can cause heart attacks and strokes. What are the causes? This condition is usually caused by a natural change in hormone levels that happens as you   get older. The condition may also be caused by surgery to remove both ovaries (bilateral oophorectomy). What increases the risk? This condition is more likely to start at an earlier age if you have certain medical conditions or treatments, including:  A tumor of the pituitary gland in the brain.  A disease that affects the ovaries and hormone production.  Radiation treatment for cancer.  Certain cancer treatments, such as chemotherapy or hormone (anti-estrogen) therapy.  Heavy smoking and excessive alcohol use.  Family history of early menopause. This condition is also more likely to develop earlier in women who are very thin. What are the signs or symptoms? Symptoms of this condition include:  Hot flashes.  Irregular menstrual periods.  Night sweats.  Changes in  feelings about sex. This could be a decrease in sex drive or an increased comfort around your sexuality.  Vaginal dryness and thinning of the vaginal walls. This may cause painful intercourse.  Dryness of the skin and development of wrinkles.  Headaches.  Problems sleeping (insomnia).  Mood swings or irritability.  Memory problems.  Weight gain.  Hair growth on the face and chest.  Bladder infections or problems with urinating. How is this diagnosed? This condition is diagnosed based on your medical history, a physical exam, your age, your menstrual history, and your symptoms. Hormone tests may also be done. How is this treated? In some cases, no treatment is needed. You and your health care provider should make a decision together about whether treatment is necessary. Treatment will be based on your individual condition and preferences. Treatment for this condition focuses on managing symptoms. Treatment may include:  Menopausal hormone therapy (MHT).  Medicines to treat specific symptoms or complications.  Acupuncture.  Vitamin or herbal supplements. Before starting treatment, make sure to let your health care provider know if you have a personal or family history of:  Heart disease.  Breast cancer.  Blood clots.  Diabetes.  Osteoporosis. Follow these instructions at home: Lifestyle  Do not use any products that contain nicotine or tobacco, such as cigarettes and e-cigarettes. If you need help quitting, ask your health care provider.  Get at least 30 minutes of physical activity on 5 or more days each week.  Avoid alcoholic and caffeinated beverages, as well as spicy foods. This may help prevent hot flashes.  Get 7-8 hours of sleep each night.  If you have hot flashes, try: ? Dressing in layers. ? Avoiding things that may trigger hot flashes, such as spicy food, warm places, or stress. ? Taking slow, deep breaths when a hot flash starts. ? Keeping a fan in  your home and office.  Find ways to manage stress, such as deep breathing, meditation, or journaling.  Consider going to group therapy with other women who are having menopause symptoms. Ask your health care provider about recommended group therapy meetings. Eating and drinking  Eat a healthy, balanced diet that contains whole grains, lean protein, low-fat dairy, and plenty of fruits and vegetables.  Your health care provider may recommend adding more soy to your diet. Foods that contain soy include tofu, tempeh, and soy milk.  Eat plenty of foods that contain calcium and vitamin D for bone health. Items that are rich in calcium include low-fat milk, yogurt, beans, almonds, sardines, broccoli, and kale. Medicines  Take over-the-counter and prescription medicines only as told by your health care provider.  Talk with your health care provider before starting any herbal supplements. If prescribed, take vitamins and supplements   as told by your health care provider. These may include: ? Calcium. Women age 51 and older should get 1,200 mg (milligrams) of calcium every day. ? Vitamin D. Women need 600-800 International Units of vitamin D each day. ? Vitamins B12 and B6. Aim for 50 micrograms of B12 and 1.5 mg of B6 each day. General instructions  Keep track of your menstrual periods, including: ? When they occur. ? How heavy they are and how long they last. ? How much time passes between periods.  Keep track of your symptoms, noting when they start, how often you have them, and how long they last.  Use vaginal lubricants or moisturizers to help with vaginal dryness and improve comfort during sex.  Keep all follow-up visits as told by your health care provider. This is important. This includes any group therapy or counseling. Contact a health care provider if:  You are still having menstrual periods after age 55.  You have pain during sex.  You have not had a period for 12 months and  you develop vaginal bleeding. Get help right away if:  You have: ? Severe depression. ? Excessive vaginal bleeding. ? Pain when you urinate. ? A fast or irregular heart beat (palpitations). ? Severe headaches. ? Abdomen (abdominal) pain or severe indigestion.  You fell and you think you have a broken bone.  You develop leg or chest pain.  You develop vision problems.  You feel a lump in your breast. Summary  Menopause is the normal time of life when menstrual periods stop completely. It is usually confirmed by 12 months without a menstrual period.  The transition to menopause (perimenopause) most often happens between the ages of 45 and 55.  Symptoms can be managed through medicines, lifestyle changes, and complementary therapies such as acupuncture.  Eat a balanced diet that is rich in nutrients to promote bone health and heart health and to manage symptoms during menopause. This information is not intended to replace advice given to you by your health care provider. Make sure you discuss any questions you have with your health care provider. Document Revised: 01/05/2017 Document Reviewed: 02/26/2016 Elsevier Patient Education  2020 Elsevier Inc.  

## 2019-12-04 ENCOUNTER — Other Ambulatory Visit: Payer: Self-pay

## 2019-12-04 ENCOUNTER — Ambulatory Visit (INDEPENDENT_AMBULATORY_CARE_PROVIDER_SITE_OTHER)
Admission: RE | Admit: 2019-12-04 | Discharge: 2019-12-04 | Disposition: A | Discharge status: Home / Self Care | Payer: Self-pay | Source: Ambulatory Visit | Attending: Cardiology | Admitting: Cardiology

## 2019-12-04 DIAGNOSIS — E785 Hyperlipidemia, unspecified: Secondary | ICD-10-CM

## 2019-12-04 DIAGNOSIS — Z8249 Family history of ischemic heart disease and other diseases of the circulatory system: Secondary | ICD-10-CM

## 2019-12-05 ENCOUNTER — Inpatient Hospital Stay: Admission: RE | Admit: 2019-12-05 | Payer: PRIVATE HEALTH INSURANCE | Source: Ambulatory Visit

## 2019-12-08 ENCOUNTER — Encounter: Payer: Self-pay | Admitting: Nurse Practitioner

## 2019-12-15 MED ORDER — ROSUVASTATIN CALCIUM 10 MG PO TABS: 10.0000 mg | ORAL_TABLET | Freq: Every day | ORAL | 3 refills | Status: DC

## 2019-12-15 NOTE — Telephone Encounter (Signed)
Routine Bloodwork Results   Lars Masson, MD  You 53 minutes ago (12:23 PM)   I agree with rosuvastatin 10 mg po daily, Emma Barajas please add to her med list, thank you, KN    Updated new med change in pts chart.  Pt aware via mychart message that Dr. Delton See agrees with this plan.

## 2020-01-27 ENCOUNTER — Ambulatory Visit: Payer: PRIVATE HEALTH INSURANCE | Attending: Internal Medicine

## 2020-01-27 DIAGNOSIS — Z23 Encounter for immunization: Secondary | ICD-10-CM

## 2020-01-27 NOTE — Progress Notes (Signed)
   Covid-19 Vaccination Clinic  Name:  Emma Barajas    MRN: [id number] DOB: 27-Jun-1970  01/27/2020  [name] was observed post Covid-19 immunization for 15 minutes without incident. She was provided with Vaccine Information Sheet and instruction to access the V-Safe system.   [name] was instructed to call 911 with any severe reactions post vaccine: Marland Kitchen Difficulty breathing  . Swelling of face and throat  . A fast heartbeat  . A bad rash all over body  . Dizziness and weakness   Immunizations Administered    Name Date Dose VIS Date Route   Moderna Covid-19 Booster Vaccine 01/27/2020  5:13 PM 0.25 mL 11/26/2019 Intramuscular   Manufacturer: Moderna   Lot: [id number]   NDC: 46270-350-09

## 2020-11-18 ENCOUNTER — Encounter: Payer: Self-pay | Admitting: Nurse Practitioner

## 2020-11-18 ENCOUNTER — Other Ambulatory Visit: Payer: Self-pay

## 2020-11-18 ENCOUNTER — Ambulatory Visit (INDEPENDENT_AMBULATORY_CARE_PROVIDER_SITE_OTHER): Payer: PRIVATE HEALTH INSURANCE | Admitting: Nurse Practitioner

## 2020-11-18 VITALS — BP 122/80 | Ht 66.5 in | Wt 159.0 lb

## 2020-11-18 DIAGNOSIS — Z30431 Encounter for routine checking of intrauterine contraceptive device: Secondary | ICD-10-CM

## 2020-11-18 DIAGNOSIS — Z833 Family history of diabetes mellitus: Secondary | ICD-10-CM

## 2020-11-18 DIAGNOSIS — N912 Amenorrhea, unspecified: Secondary | ICD-10-CM | POA: Diagnosis not present

## 2020-11-18 DIAGNOSIS — R232 Flushing: Secondary | ICD-10-CM | POA: Diagnosis not present

## 2020-11-18 DIAGNOSIS — Z01419 Encounter for gynecological examination (general) (routine) without abnormal findings: Secondary | ICD-10-CM

## 2020-11-18 DIAGNOSIS — E785 Hyperlipidemia, unspecified: Secondary | ICD-10-CM

## 2020-11-18 NOTE — Progress Notes (Signed)
   Emma Barajas 11-16-1970 [id number]   History:  50 y.o. G0 presents for annual exam. Amenorrheic/Liletta IUD 05/2015. Having some menopausal symptoms, good management with Estroven. FSH 50 one year ago.  2002 LEEP, subsequent paps normal. Normal mammogram history. HLD managed by PCP, prescribed Rosuvastatin but patient ran out and has not follow up with PCP.   No LMP recorded (lmp unknown). (Menstrual status: IUD).   Contraception: IUD  Health maintenance Last Pap: 11/06/2016. Results were: normal, 5-year repeat Last mammogram: 12/08/2019. Results were: normal Last colonoscopy: Never Last Dexa:Not indicated  Past medical history, past surgical history, family history and social history were all reviewed and documented in the EPIC chart. Married. Works for [employer].  ROS:  A ROS was performed and pertinent positives and negatives are included.  Exam:  Vitals:   11/18/20 0808  BP: 122/80  Weight: 159 lb (72.1 kg)  Height: 5' 6.5" (1.689 m)    Body mass index is 25.28 kg/m.  General appearance:  Normal Thyroid:  Symmetrical, normal in size, without palpable masses or nodularity. Respiratory  Auscultation:  Expiratory wheezing in left lobes Cardiovascular  Auscultation:  Regular rate, without rubs, murmurs or gallops  Edema/varicosities:  Not grossly evident Abdominal  Soft,nontender, without masses, guarding or rebound.  Liver/spleen:  No organomegaly noted  Hernia:  None appreciated  Skin  Inspection:  Grossly normal   Breasts: Examined lying and sitting.   Right: Without masses, retractions, discharge or axillary adenopathy.   Left: Without masses, retractions, discharge or axillary adenopathy. Gentitourinary   Inguinal/mons:  Normal without inguinal adenopathy  External genitalia:  Normal  BUS/Urethra/Skene's glands:  Normal  Vagina:  Normal  Cervix:  Normal, IUD string visible in os  Uterus:  Normal in size, shape and contour.  Midline and  mobile  Adnexa/parametria:     Rt: Without masses or tenderness.   Lt: Without masses or tenderness.  Anus and perineum: Normal  Digital rectal exam: Normal sphincter tone without palpated masses or tenderness  Assessment/Plan:  49 y.o. G0 for annual exam.   Well female exam with routine gynecological exam - Plan: CBC with Differential/Platelet, Comprehensive metabolic panel. Education provided on SBEs, importance of preventative screenings, current guidelines, high calcium diet, regular exercise, and multivitamin daily. Requests lab today. Will return fasting.  Encounter for routine checking of intrauterine contraceptive device (IUD) - Liletta IUD 05/2015. Will return 05/2021 for removal. Amenorrheic, likely menopausal.   Amenorrhea - Plan: Follicle stimulating hormone  Hot flashes - Plan: Follicle stimulating hormone. Good management with Estroven.  Hyperlipidemia, unspecified hyperlipidemia type - Plan: Lipid panel. Was on Rosuvastatin prescribed by PCP. Ran out of prescription. Aware that she will need to see PCP for refills.  Family history of diabetes mellitus - Plan: Hemoglobin A1c. Father recently diagnosed with prediabetes or diabetes?  Screening for cervical cancer - 2002 LEEP, subsequent paps normal. Will repeat at 5-year interval per guidelines.  Screening for breast cancer - Normal mammogram history.  Continue annual screenings.  Normal breast exam today.  Screening for colon cancer -  Has not had screening colonoscopy. Discussed current guidelines and importance of preventative screenings. Information provided on [facility].   Follow-up in 1 year for annual.      Olivia Mackie [facility], 8:32 AM 11/18/2020

## 2020-11-18 NOTE — Patient Instructions (Signed)
Schedule Colonoscopy! ?[facility] ?[phone] ?[address] [place], [place] [address] ? ?

## 2020-11-19 ENCOUNTER — Other Ambulatory Visit: Payer: PRIVATE HEALTH INSURANCE

## 2020-11-19 DIAGNOSIS — N912 Amenorrhea, unspecified: Secondary | ICD-10-CM

## 2020-11-19 DIAGNOSIS — E785 Hyperlipidemia, unspecified: Secondary | ICD-10-CM

## 2020-11-19 DIAGNOSIS — Z01419 Encounter for gynecological examination (general) (routine) without abnormal findings: Secondary | ICD-10-CM

## 2020-11-19 DIAGNOSIS — R232 Flushing: Secondary | ICD-10-CM

## 2020-11-19 DIAGNOSIS — Z833 Family history of diabetes mellitus: Secondary | ICD-10-CM

## 2020-11-20 LAB — COMPREHENSIVE METABOLIC PANEL
AG Ratio: 1.6 (calc) (ref 1.0–2.5)
ALT: 10 U/L (ref 6–29)
AST: 20 U/L (ref 10–35)
Albumin: 4.5 g/dL (ref 3.6–5.1)
Alkaline phosphatase (APISO): 87 U/L (ref 37–153)
BUN: 22 mg/dL (ref 7–25)
CO2: 26 mmol/L (ref 20–32)
Calcium: 9.8 mg/dL (ref 8.6–10.4)
Chloride: 106 mmol/L (ref 98–110)
Creat: 0.77 mg/dL (ref 0.50–1.03)
Globulin: 2.9 g/dL (calc) (ref 1.9–3.7)
Glucose, Bld: 93 mg/dL (ref 65–99)
Potassium: 5.2 mmol/L (ref 3.5–5.3)
Sodium: 143 mmol/L (ref 135–146)
Total Bilirubin: 0.6 mg/dL (ref 0.2–1.2)
Total Protein: 7.4 g/dL (ref 6.1–8.1)

## 2020-11-20 LAB — CBC WITH DIFFERENTIAL/PLATELET
Absolute Monocytes: 357 cells/uL (ref 200–950)
Basophils Absolute: 51 cells/uL (ref 0–200)
Basophils Relative: 1 %
Eosinophils Absolute: 82 cells/uL (ref 15–500)
Eosinophils Relative: 1.6 %
HCT: 41.9 % (ref 35.0–45.0)
Hemoglobin: 13.3 g/dL (ref 11.7–15.5)
Lymphs Abs: 1285 cells/uL (ref 850–3900)
MCH: 27.5 pg (ref 27.0–33.0)
MCHC: 31.7 g/dL — ABNORMAL LOW (ref 32.0–36.0)
MCV: 86.6 fL (ref 80.0–100.0)
MPV: 10.5 fL (ref 7.5–12.5)
Monocytes Relative: 7 %
Neutro Abs: 3325 cells/uL (ref 1500–7800)
Neutrophils Relative %: 65.2 %
Platelets: 230 10*3/uL (ref 140–400)
RBC: 4.84 10*6/uL (ref 3.80–5.10)
RDW: 15.7 % — ABNORMAL HIGH (ref 11.0–15.0)
Total Lymphocyte: 25.2 %
WBC: 5.1 10*3/uL (ref 3.8–10.8)

## 2020-11-20 LAB — HEMOGLOBIN A1C
Hgb A1c MFr Bld: 5.1 % of total Hgb (ref <5.7)
Mean Plasma Glucose: 100 mg/dL
eAG (mmol/L): 5.5 mmol/L

## 2020-11-20 LAB — LIPID PANEL
Cholesterol: 220 mg/dL — ABNORMAL HIGH (ref <200)
HDL: 83 mg/dL (ref >=50)
LDL Cholesterol (Calc): 115 mg/dL (calc) — ABNORMAL HIGH
Non-HDL Cholesterol (Calc): 137 mg/dL (calc) — ABNORMAL HIGH (ref <130)
Total CHOL/HDL Ratio: 2.7 (calc) (ref <5.0)
Triglycerides: 111 mg/dL (ref <150)

## 2020-11-20 LAB — FOLLICLE STIMULATING HORMONE: FSH: 56.2 m[IU]/mL

## 2020-12-16 ENCOUNTER — Encounter: Payer: Self-pay | Admitting: Nurse Practitioner

## 2020-12-29 ENCOUNTER — Telehealth: Payer: Self-pay | Admitting: Cardiology

## 2020-12-29 MED ORDER — AMLODIPINE BESYLATE 5 MG PO TABS: 5.0000 mg | ORAL_TABLET | Freq: Every day | ORAL | 0 refills | Status: DC

## 2020-12-29 MED ORDER — ROSUVASTATIN CALCIUM 10 MG PO TABS: 10.0000 mg | ORAL_TABLET | Freq: Every day | ORAL | 0 refills | Status: DC

## 2020-12-29 NOTE — Telephone Encounter (Signed)
Pt's medications were sent to pt's pharmacy as requested. Confirmation received.  

## 2020-12-29 NOTE — Telephone Encounter (Signed)
*  STAT* If patient is at the pharmacy, call can be transferred to refill team.   1. Which medications need to be refilled? (please list name of each medication and dose if known)  amLODipine (NORVASC) 5 MG tablet rosuvastatin (CRESTOR) 10 MG tablet  2. Which pharmacy/location (including street and city if local pharmacy) is medication to be sent to?[facility]  3. Do they need a 30 day or 90 day supply? 90 day

## 2021-02-18 ENCOUNTER — Ambulatory Visit (INDEPENDENT_AMBULATORY_CARE_PROVIDER_SITE_OTHER): Payer: PRIVATE HEALTH INSURANCE | Admitting: Nurse Practitioner

## 2021-02-18 ENCOUNTER — Other Ambulatory Visit (INDEPENDENT_AMBULATORY_CARE_PROVIDER_SITE_OTHER): Payer: PRIVATE HEALTH INSURANCE

## 2021-02-18 ENCOUNTER — Other Ambulatory Visit: Payer: Self-pay

## 2021-02-18 ENCOUNTER — Ambulatory Visit (INDEPENDENT_AMBULATORY_CARE_PROVIDER_SITE_OTHER)
Admission: RE | Admit: 2021-02-18 | Discharge: 2021-02-18 | Disposition: A | Discharge status: Home / Self Care | Payer: PRIVATE HEALTH INSURANCE | Source: Ambulatory Visit | Attending: Nurse Practitioner | Admitting: Nurse Practitioner

## 2021-02-18 ENCOUNTER — Encounter: Payer: Self-pay | Admitting: Nurse Practitioner

## 2021-02-18 VITALS — BP 136/74 | HR 68 | Ht 66.5 in | Wt 160.0 lb

## 2021-02-18 DIAGNOSIS — R1011 Right upper quadrant pain: Secondary | ICD-10-CM

## 2021-02-18 DIAGNOSIS — Z1211 Encounter for screening for malignant neoplasm of colon: Secondary | ICD-10-CM | POA: Diagnosis not present

## 2021-02-18 DIAGNOSIS — R1012 Left upper quadrant pain: Secondary | ICD-10-CM

## 2021-02-18 LAB — CBC WITH DIFFERENTIAL/PLATELET
Basophils Absolute: 0.1 10*3/uL (ref 0.0–0.1)
Basophils Relative: 0.9 % (ref 0.0–3.0)
Eosinophils Absolute: 0.1 10*3/uL (ref 0.0–0.7)
Eosinophils Relative: 1.7 % (ref 0.0–5.0)
HCT: 37.7 % (ref 36.0–46.0)
Hemoglobin: 12.5 g/dL (ref 12.0–15.0)
Lymphocytes Relative: 24.1 % (ref 12.0–46.0)
Lymphs Abs: 1.4 10*3/uL (ref 0.7–4.0)
MCHC: 33.2 g/dL (ref 30.0–36.0)
MCV: 90.1 fl (ref 78.0–100.0)
Monocytes Absolute: 0.4 10*3/uL (ref 0.1–1.0)
Monocytes Relative: 6.5 % (ref 3.0–12.0)
Neutro Abs: 3.8 10*3/uL (ref 1.4–7.7)
Neutrophils Relative %: 66.8 % (ref 43.0–77.0)
Platelets: 227 10*3/uL (ref 150.0–400.0)
RBC: 4.19 Mil/uL (ref 3.87–5.11)
RDW: 14 % (ref 11.5–15.5)
WBC: 5.7 10*3/uL (ref 4.0–10.5)

## 2021-02-18 LAB — COMPREHENSIVE METABOLIC PANEL
ALT: 12 U/L (ref 0–35)
AST: 24 U/L (ref 0–37)
Albumin: 4.7 g/dL (ref 3.5–5.2)
Alkaline Phosphatase: 92 U/L (ref 39–117)
BUN: 15 mg/dL (ref 6–23)
CO2: 26 mEq/L (ref 19–32)
Calcium: 9.7 mg/dL (ref 8.4–10.5)
Chloride: 102 mEq/L (ref 96–112)
Creatinine, Ser: 0.66 mg/dL (ref 0.40–1.20)
GFR: 101.23 mL/min (ref >60.00)
Glucose, Bld: 83 mg/dL (ref 70–99)
Potassium: 4.2 mEq/L (ref 3.5–5.1)
Sodium: 140 mEq/L (ref 135–145)
Total Bilirubin: 0.7 mg/dL (ref 0.2–1.2)
Total Protein: 7.8 g/dL (ref 6.0–8.3)

## 2021-02-18 LAB — C-REACTIVE PROTEIN: CRP: <1 mg/dL (ref 0.5–20.0)

## 2021-02-18 MED ORDER — SUPREP BOWEL PREP KIT 17.5-3.13-1.6 GM/177ML PO SOLN: 1.0000 | ORAL | 0 refills | Status: DC

## 2021-02-18 MED ORDER — IOHEXOL 300 MG/ML  SOLN
100.0000 mL | Freq: Once | INTRAMUSCULAR | Status: AC | PRN
  Administered 2021-02-18: 100 mL via INTRAVENOUS

## 2021-02-18 NOTE — Progress Notes (Signed)
02/20/2021 Emma Barajas [id number] November 20, 1970   CHIEF COMPLAINT: Right and left upper abdominal pain, schedule a screening colonoscopy   HISTORY OF PRESENT ILLNESS: Emma Barajas is a 51 year old female with a past medical history of hypertension, PVCs, and chronic upper abdominal pain.   She as initially seen in our office by Tye Savoy PA-C 11/15/2016 due to having intermittent diffuse upper abdominal pain which was positional and thought to be musculoskeletal in etiology and she was treated with Naprosyn, Omeprazole while on Naprosyn and heating pad.   She presents to our office today with the same RUQ and LUQ pain and to schedule a screening colonoscopy. Her upper abdominal pain has persisted since 2018 and has progressively worsened over th past year. She describes a contant level of upper abdominal pain which worsens with change of position. She swims, does yoga and walks on a regular basis. Her abdominal pain remains positional, worse when she twists her torso from side to side. She saw a message therapist and her RUQ and LUQ pain was worse when in the supine position. She uses an inversion table once weekly for back pain and feels her upper abdomen gets "squished" when in the inverted position. No recent weight gain or loss. No N/V. No dysphagia or heartburn. Infrequent NSAID use. She drinks green tea. She drinks 2 to 3 glasses of wine every night and sometimes drinks 4 to 5 beers on the weekends. She is passing a normal formed brown BM most days, no rectal bleeding or black stools. Never had a screening colonoscopy. No known family history of colorectal cancer.   CBC Latest Ref Rng & Units 02/18/2021 11/19/2020 12/20/2015  WBC 4.0 - 10.5 K/uL 5.7 5.1 -  Hemoglobin 12.0 - 15.0 g/dL 12.5 13.3 13.9  Hematocrit 36.0 - 46.0 % 37.7 41.9 41.0  Platelets 150.0 - 400.0 K/uL 227.0 230 -    CMP Latest Ref Rng & Units 02/18/2021 11/19/2020 12/20/2015  Glucose 70 - 99 mg/dL 83 93 97  BUN 6 -  23 mg/dL _0 Creatinine 0.40 - 1.20 mg/dL 0.66 0.77 0.70  Sodium 135 - 145 mEq/L 140 143 136  Potassium 3.5 - 5.1 mEq/L 4.2 5.2 4.6  Chloride 96 - 112 mEq/L 102 106 103  CO2 19 - 32 mEq/L 26 26 -  Calcium 8.4 - 10.5 mg/dL 9.7 9.8 -  Total Protein 6.0 - 8.3 g/dL 7.8 7.4 -  Total Bilirubin 0.2 - 1.2 mg/dL 0.7 0.6 -  Alkaline Phos 39 - 117 U/L 92 - -  AST 0 - 37 U/L 24 20 -  ALT 0 - 35 U/L 12 10 -    Past Medical History:  Diagnosis Date   Essential hypertension    HGSIL (high grade squamous intraepithelial dysplasia) 2002   LEEP   IUD 02-23-10   ORIGINALLY INSERTED 01/09/05 AND REPLACE ON 02-23-10   PVC's (premature ventricular contractions)    a. Event monitor 04/2013 showed frequent PVCs - total 8160 (4.8% of the time). F/u 2D echo 06/2013: EF 60-65%, grade 2 DD.   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 Nonsmoker. She drinks  2 to 3 glasses of wine every evening and sometimes drinks 4 to 5 beers on the weekends.   Family History: Father with prediabetes, CVA, afib and prostate cancer. Mother with hypertension. Maternal grandmother died age 15 MI.   Allergies  Allergen Reactions   Other Shortness Of Breath    "PARSNIPS"      Outpatient Encounter Medications as of 02/18/2021  Medication Sig   amLODipine (NORVASC) 5 MG tablet Take 1 tablet (5 mg total) by mouth daily. Please keep upcoming appt in March 2023 with Dr. Johney Frame new Cardiologist before anymore refills. Thank you   Calcium Carbonate-Vitamin D (CALCIUM + D PO) Take 1 tablet by mouth daily.    cetirizine (ZYRTEC) 10 MG tablet Take 10 mg by mouth daily.   Cholecalciferol (VITAMIN D PO) Take 1 tablet by mouth daily.    Levonorgestrel  (LILETTA, 52 MG, IU) by Intrauterine route.   Omega-3 Fatty Acids (FISH OIL PO) Take 1 capsule by mouth daily.    rosuvastatin (CRESTOR) 10 MG tablet Take 1 tablet (10 mg total) by mouth daily. Please keep upcoming appt in March 2023 with Dr. Johney Frame new Cardiologist before anymore refills. Thank you   SUPREP BOWEL PREP KIT 17.5-3.13-1.6 GM/177ML SOLN Take 1 kit by mouth as directed. For colonoscopy prep   valACYclovir (VALTREX) 1000 MG tablet Take 1,000 mg by mouth daily as needed (cold sores).    B Complex-C (SUPER B COMPLEX PO) Take 1 tablet by mouth daily.    [DISCONTINUED] Coenzyme Q10 (CO Q 10 PO) Take 100 mg by mouth daily.    [DISCONTINUED] MAGNESIUM PO Take 125 mg by mouth daily.    [DISCONTINUED] Probiotic Product (PROBIOTIC DAILY PO) Take 1 tablet by mouth daily.    [DISCONTINUED] terbinafine (LAMISIL) 250 MG tablet Take 250 mg by mouth daily.   No facility-administered encounter medications on file as of 02/18/2021.    REVIEW OF SYSTEMS:  Gen: Denies fever, sweats or chills. No weight loss.  CV: Denies chest pain, palpitations or edema. Resp: Denies cough, shortness of breath of hemoptysis.  GI: See HPI.  GU : Denies urinary burning, blood in urine, increased urinary frequency or incontinence. MS: Back pain.  Derm: Denies rash, itchiness, skin lesions or unhealing ulcers. Psych: Denies depression, anxiety or memory loss. Heme: Denies bruising, bleeding. Neuro:  Denies headaches, dizziness or paresthesias. Endo:  Denies any problems with DM, thyroid or adrenal function.  PHYSICAL EXAM: BP 136/74    Pulse 68    Ht 5' 6.5" (1.689 m)    Wt 160 lb (72.6 kg)    SpO2 99%    BMI 25.44 kg/m  General: 51 year old female in NAD. Head: Normocephalic and atraumatic. Eyes:  Sclerae non-icteric, conjunctive pink. Ears: Normal auditory acuity. Mouth: Dentition intact. No ulcers or lesions.  Neck: Supple, no lymphadenopathy or thyromegaly.  Lungs: Clear bilaterally to auscultation  without wheezes, crackles or rhonchi. Heart: Regular rate and rhythm. No murmur, rub or gallop appreciated.  Abdomen: Soft, nontender, non distended. No masses. No hepatosplenomegaly. Normoactive bowel sounds x 4 quadrants.  Rectal: Deferred. Musculoskeletal: Symmetrical with no gross deformities. Skin: Warm and dry. No rash or lesions on visible extremities. Extremities: No edema. Neurological: Alert oriented x 4, no focal deficits.  Psychological:  Alert and cooperative. Normal mood and affect.  ASSESSMENT AND PLAN:  35) 51 year old female with chronic RUQ and LUQ abdominal pain since 2018 which has progressively worsened over the  past year. No N/V or heartburn.  -  CBC, CMP and CRP -CTAP with oral and IV contrast  -EGD benefits and risks discussed including risk with sedation, risk of bleeding, perforation and infection   2) Colon cancer screening  -Colonoscopy benefits and risks discussed including risk with sedation, risk of bleeding, perforation and infection   3) Alcohol use disorder  -Encouraged patient to reduce alcohol intake    Further recommendations to be determined after the above evaluation completed       CC:  Everardo Beals, NP

## 2021-02-18 NOTE — Patient Instructions (Addendum)
PROCEDURES: You have been scheduled for an EGD and Colonoscopy. Please follow the written instructions given to you at your visit today. Please pick up your prep supplies at the pharmacy within the next 1-3 days. If you use inhalers (even only as needed), please bring them with you on the day of your procedure.  LABS:  Lab work has been ordered for you today. Our lab is located in the basement. Press "B" on the elevator. The lab is located at the first door on the left as you exit the elevator.  HEALTHCARE LAWS AND MY CHART RESULTS: Due to recent changes in healthcare laws, you may see the results of your imaging and laboratory studies on MyChart before your provider has had a chance to review them.   We understand that in some cases there may be results that are confusing or concerning to you. Not all laboratory results come back in the same time frame and the provider may be waiting for multiple results in order to interpret others.  Please give [place] 48 hours in order for your provider to thoroughly review all the results before contacting the office for clarification of your results.   You have been scheduled for a CT scan of the abdomen and pelvis at [facility] are scheduled on Today at [place] should arrive 15 minutes prior to your appointment time for registration. Please follow the written instructions below on the day of your exam:  WARNING: IF YOU ARE ALLERGIC TO IODINE/X-RAY DYE, PLEASE NOTIFY RADIOLOGY IMMEDIATELY AT [phone]! YOU WILL BE GIVEN A 13 HOUR PREMEDICATION PREP.  1) Do not eat or drink anything after 4 hours prior to your test 2) You have been given 2 bottles of oral contrast to drink. The solution may taste better if refrigerated, but do NOT add ice or any other liquid to this solution. Shake well before drinking.    Drink 1 bottle of contrast @ 12pm (2 hours prior to your exam)  Drink 1 bottle of contrast @ 1pm (1 hour prior to your  exam)  You may take any medications as prescribed with a small amount of water, if necessary. If you take any of the following medications: METFORMIN, GLUCOPHAGE, GLUCOVANCE, AVANDAMET, RIOMET, FORTAMET, [place] MET, JANUMET, GLUMETZA or METAGLIP, you MAY be asked to HOLD this medication 48 hours AFTER the exam.  The purpose of you drinking the oral contrast is to aid in the visualization of your intestinal tract. The contrast solution may cause some diarrhea. Depending on your individual set of symptoms, you may also receive an intravenous injection of x-ray contrast/dye. Plan on being at [facility] for 30 minutes or longer, depending on the type of exam you are having performed.  This test typically takes 30-45 minutes to complete.  _______________________________________________________________________   RECOMMENDATIONS: Omeprazole 20 MG once a day. This is over the counter. Reduce alcohol intake. Further recommendations to be determined after your procedures and CT scan.  It was great seeing you today! Thank you for entrusting me with your care and choosing [facility].  Noralyn Pick, CRNP  The [facility] providers would like to encourage you to use [facility] to communicate with providers for non-urgent requests or questions.  Due to long hold times on the telephone, sending your provider a message by [facility] may be faster and more efficient way to get a response. Please allow 48 business hours for a response.  Please remember that this is for  non-urgent requests/questions.  If you are age 12 or older, your body mass index should be between 23-30. Your Body mass index is 25.44 kg/m. If this is out of the aforementioned range listed, please consider follow up with your Primary Care Provider.  If you are age 12 or younger, your body mass index should be between 19-25. Your Body mass index is 25.44 kg/m. If this is out of the aformentioned range listed,  please consider follow up with your Primary Care Provider.

## 2021-02-20 DIAGNOSIS — R1012 Left upper quadrant pain: Secondary | ICD-10-CM | POA: Insufficient documentation

## 2021-02-20 DIAGNOSIS — R1011 Right upper quadrant pain: Secondary | ICD-10-CM | POA: Insufficient documentation

## 2021-02-21 ENCOUNTER — Telehealth: Payer: Self-pay | Admitting: Gastroenterology

## 2021-02-21 MED ORDER — NA SULFATE-K SULFATE-MG SULF 17.5-3.13-1.6 GM/177ML PO SOLN: 1.0000 | ORAL | 0 refills | Status: DC

## 2021-02-21 NOTE — Telephone Encounter (Signed)
Patient called states Emma Barajas needs a script sent or a call for a generic brand prep because it is all they have. Patient is to prep this afternoon she requested a call once done so she knows.

## 2021-02-21 NOTE — Telephone Encounter (Signed)
Generic sent

## 2021-02-22 ENCOUNTER — Ambulatory Visit (AMBULATORY_SURGERY_CENTER): Payer: PRIVATE HEALTH INSURANCE | Admitting: Gastroenterology

## 2021-02-22 ENCOUNTER — Encounter: Payer: Self-pay | Admitting: Gastroenterology

## 2021-02-22 VITALS — BP 134/95 | HR 73 | Temp 96.8°F | Resp 15 | Ht 66.0 in | Wt 160.0 lb

## 2021-02-22 DIAGNOSIS — K319 Disease of stomach and duodenum, unspecified: Secondary | ICD-10-CM

## 2021-02-22 DIAGNOSIS — Z1211 Encounter for screening for malignant neoplasm of colon: Secondary | ICD-10-CM

## 2021-02-22 DIAGNOSIS — R1011 Right upper quadrant pain: Secondary | ICD-10-CM

## 2021-02-22 DIAGNOSIS — K2970 Gastritis, unspecified, without bleeding: Secondary | ICD-10-CM

## 2021-02-22 DIAGNOSIS — K2100 Gastro-esophageal reflux disease with esophagitis, without bleeding: Secondary | ICD-10-CM | POA: Diagnosis not present

## 2021-02-22 DIAGNOSIS — Z1212 Encounter for screening for malignant neoplasm of rectum: Secondary | ICD-10-CM

## 2021-02-22 MED ORDER — SODIUM CHLORIDE 0.9 % IV SOLN: 500.0000 mL | Freq: Once | INTRAVENOUS | Status: DC

## 2021-02-22 MED ORDER — PANTOPRAZOLE SODIUM 40 MG PO TBEC: 40.0000 mg | DELAYED_RELEASE_TABLET | Freq: Every day | ORAL | 1 refills | Status: DC

## 2021-02-22 NOTE — Progress Notes (Signed)
Pt's states no medical or surgical changes since previsit or office visit. VS assessed by D.T 

## 2021-02-22 NOTE — Progress Notes (Signed)
Called to room to assist during endoscopic procedure.  Patient ID and intended procedure confirmed with present staff. Received instructions for my participation in the procedure from the performing physician.  

## 2021-02-22 NOTE — Progress Notes (Signed)
To pacu, VSS. Report to [email or web address] 

## 2021-02-22 NOTE — Op Note (Signed)
[place] Patient Name: Emma Barajas Procedure Date: 02/22/2021 10:33 AM MRN: [id number] Endoscopist: Ladene Artist , MD Age: 51 Referring MD:  Date of Birth: 09-25-1970 Gender: Female Account #: [id number] Procedure:                Upper GI endoscopy Indications:              Abdominal pain in the right upper quadrant Medicines:                Monitored Anesthesia Care Procedure:                Pre-Anesthesia Assessment:                           - Prior to the procedure, a History and Physical                            was performed, and patient medications and                            allergies were reviewed. The patient's tolerance of                            previous anesthesia was also reviewed. The risks                            and benefits of the procedure and the sedation                            options and risks were discussed with the patient.                            All questions were answered, and informed consent                            was obtained. Prior Anticoagulants: The patient has                            taken no previous anticoagulant or antiplatelet                            agents. ASA Grade Assessment: II - A patient with                            mild systemic disease. After reviewing the risks                            and benefits, the patient was deemed in                            satisfactory condition to undergo the procedure.                           After obtaining informed consent, the endoscope was  passed under direct vision. Throughout the                            procedure, the patient's blood pressure, pulse, and                            oxygen saturations were monitored continuously. The                            Olympus Endoscope [id number] was introduced through                            the mouth, and advanced to the second part of                            duodenum. The  upper GI endoscopy was accomplished                            without difficulty. The patient tolerated the                            procedure well. Scope In: Scope Out: Findings:                 LA Grade A (one or more mucosal breaks less than 5                            mm, not extending between tops of 2 mucosal folds)                            esophagitis with no bleeding was found in the                            distal esophagus.                           The exam of the esophagus was otherwise normal.                           Patchy mildly erythematous mucosa without bleeding                            was found in the gastric antrum and in the                            prepyloric region of the stomach. Biopsies were                            taken with a cold forceps for histology.                           The exam of the stomach was otherwise normal.  The duodenal bulb and second portion of the                            duodenum were normal. Complications:            No immediate complications. Estimated Blood Loss:     Estimated blood loss was minimal. Impression:               - LA Grade A reflux esophagitis with no bleeding.                           - Erythematous mucosa in the antrum and prepyloric                            region of the stomach. Biopsied.                           - Normal duodenal bulb and second portion of the                            duodenum. Recommendation:           - Patient has a contact number available for                            emergencies. The signs and symptoms of potential                            delayed complications were discussed with the                            patient. Return to normal activities tomorrow.                            Written discharge instructions were provided to the                            patient.                           - Resume previous diet.                            - Continue present medications.                           - Await pathology results.                           - Protonix (pantoprazole) 40 mg PO daily for 2                            months. Ladene Artist, MD 02/22/2021 11:26:58 AM This report has been signed electronically.

## 2021-02-22 NOTE — Patient Instructions (Signed)
Handouts provided on gastritis and esophagitis.   Repeat colonoscopy in 10 years for screening purposes.   Take Protonix (pantoprazole) 40mg  by mouth daily for 2 months. This is to treat the gastritis and esophagitis seen today and to help reduce acid production to see if your symptoms improve. If you develop side effects to this medication please contact the office so that a different medication can be prescribed.   Await pathology results.   YOU HAD AN ENDOSCOPIC PROCEDURE TODAY AT [facility]:   Refer to the procedure report that was given to you for any specific questions about what was found during the examination.  If the procedure report does not answer your questions, please call your gastroenterologist to clarify.  If you requested that your care partner not be given the details of your procedure findings, then the procedure report has been included in a sealed envelope for you to review at your convenience later.  YOU SHOULD EXPECT: Some feelings of bloating in the abdomen. Passage of more gas than usual.  Walking can help get rid of the air that was put into your GI tract during the procedure and reduce the bloating. If you had a lower endoscopy (such as a colonoscopy or flexible sigmoidoscopy) you may notice spotting of blood in your stool or on the toilet paper. If you underwent a bowel prep for your procedure, you may not have a normal bowel movement for a few days.  Please Note:  You might notice some irritation and congestion in your nose or some drainage.  This is from the oxygen used during your procedure.  There is no need for concern and it should clear up in a day or so.  SYMPTOMS TO REPORT IMMEDIATELY:  Following lower endoscopy (colonoscopy or flexible sigmoidoscopy):  Excessive amounts of blood in the stool  Significant tenderness or worsening of abdominal pains  Swelling of the abdomen that is new, acute  Fever of 100F or higher  Following upper  endoscopy (EGD)  Vomiting of blood or coffee ground material  New chest pain or pain under the shoulder blades  Painful or persistently difficult swallowing  New shortness of breath  Fever of 100F or higher  Black, tarry-looking stools  For urgent or emergent issues, a gastroenterologist can be reached at any hour by calling [phone]. Do not use MyChart messaging for urgent concerns.    DIET:  We do recommend a small meal at first, but then you may proceed to your regular diet.  Drink plenty of fluids but you should avoid alcoholic beverages for 24 hours.  ACTIVITY:  You should plan to take it easy for the rest of today and you should NOT DRIVE or use heavy machinery until tomorrow (because of the sedation medicines used during the test).    FOLLOW UP: Our staff will call the number listed on your records 48-72 hours following your procedure to check on you and address any questions or concerns that you may have regarding the information given to you following your procedure. If we do not reach you, we will leave a message.  We will attempt to reach you two times.  During this call, we will ask if you have developed any symptoms of COVID 19. If you develop any symptoms (ie: fever, flu-like symptoms, shortness of breath, cough etc.) before then, please call [phone].  If you test positive for Covid 19 in the 2 weeks post procedure, please call and report this information to  [place].    If any biopsies were taken you will be contacted by phone or by letter within the next 1-3 weeks.  Please call us at [phone] if you have not heard about the biopsies in 3 weeks.    SIGNATURES/CONFIDENTIALITY: You and/or your care partner have signed paperwork which will be entered into your electronic medical record.  These signatures attest to the fact that that the information above on your After Visit Summary has been reviewed and is understood.  Full responsibility of the confidentiality of this  discharge information lies with you and/or your care-partner.

## 2021-02-22 NOTE — Op Note (Signed)
[facility] Patient Name: Emma ChurnLea Devincent Procedure Date: 02/22/2021 10:39 AM MRN: [id number] Endoscopist: Meryl DareMalcolm T Samiya Mervin , MD Age: 51 Referring MD:  Date of Birth: Jul 29, 1970 Gender: Female Account #: [id number] Procedure:                Colonoscopy Indications:              Screening for colorectal malignant neoplasm Medicines:                Monitored Anesthesia Care Procedure:                Pre-Anesthesia Assessment:                           - Prior to the procedure, a History and Physical                            was performed, and patient medications and                            allergies were reviewed. The patient's tolerance of                            previous anesthesia was also reviewed. The risks                            and benefits of the procedure and the sedation                            options and risks were discussed with the patient.                            All questions were answered, and informed consent                            was obtained. Prior Anticoagulants: The patient has                            taken no previous anticoagulant or antiplatelet                            agents. ASA Grade Assessment: II - A patient with                            mild systemic disease. After reviewing the risks                            and benefits, the patient was deemed in                            satisfactory condition to undergo the procedure.                           After obtaining informed consent, the colonoscope  was passed under direct vision. Throughout the                            procedure, the patient's blood pressure, pulse, and                            oxygen saturations were monitored continuously. The                            Olympus [id number] (#[id number]) Colonoscope was                            introduced through the anus and advanced to the the                            cecum,  identified by appendiceal orifice and                            ileocecal valve. The ileocecal valve, appendiceal                            orifice, and rectum were photographed. The quality                            of the bowel preparation was good. The colonoscopy                            was somewhat difficult due to a redundant colon,                            significant looping and a tortuous colon.                            Successful completion of the procedure was aided by                            using manual pressure, straightening and shortening                            the scope to obtain bowel loop reduction and using                            scope torsion. The patient tolerated the procedure                            well. Scope In: 10:49:30 AM Scope Out: 11:11:23 AM Scope Withdrawal Time: 0 hours 8 minutes 50 seconds  Total Procedure Duration: 0 hours 21 minutes 53 seconds  Findings:                 The perianal and digital rectal examinations were                            normal.  The entire examined colon appeared normal on direct                            and retroflexion views. Complications:            No immediate complications. Estimated blood loss:                            None. Estimated Blood Loss:     Estimated blood loss: none. Impression:               - The entire examined colon is normal on direct and                            retroflexion views.                           - No specimens collected. Recommendation:           - Repeat colonoscopy in 10 years for screening                            purposes.                           - Patient has a contact number available for                            emergencies. The signs and symptoms of potential                            delayed complications were discussed with the                            patient. Return to normal activities tomorrow.                             Written discharge instructions were provided to the                            patient.                           - Resume previous diet.                           - Continue present medications. Meryl Dare, MD 02/22/2021 11:14:27 AM This report has been signed electronically.

## 2021-02-22 NOTE — Progress Notes (Signed)
See 02/18/2021, H&P, no changes.

## 2021-02-24 ENCOUNTER — Telehealth: Payer: Self-pay

## 2021-02-24 NOTE — Telephone Encounter (Signed)
°  Follow up Call-  Call back number 02/22/2021  Post procedure Call Back phone  # [phone]  Permission to leave phone message Yes  Some recent data might be hidden     Patient questions:  Do you have a fever, pain , or abdominal swelling? No. Pain Score  0 *  Have you tolerated food without any problems? Yes.    Have you been able to return to your normal activities? Yes.    Do you have any questions about your discharge instructions: Diet   No. Medications  No. Follow up visit  No.  Do you have questions or concerns about your Care? No.  Actions: * If pain score is 4 or above: No action needed, pain <4.  Have you developed a fever since your procedure? no  2.   Have you had an respiratory symptoms (SOB or cough) since your procedure? no  3.   Have you tested positive for COVID 19 since your procedure no  4.   Have you had any family members/close contacts diagnosed with the COVID 19 since your procedure?  no   If yes to any of these questions please route to Laverna Peace, RN and Karlton Lemon, RN

## 2021-03-02 ENCOUNTER — Encounter: Payer: Self-pay | Admitting: Gastroenterology

## 2021-04-07 ENCOUNTER — Other Ambulatory Visit: Payer: Self-pay

## 2021-04-07 MED ORDER — AMLODIPINE BESYLATE 5 MG PO TABS: 5.0000 mg | ORAL_TABLET | Freq: Every day | ORAL | 0 refills | Status: DC

## 2021-04-07 NOTE — Telephone Encounter (Signed)
Pt's medication was sent to pt's pharmacy as requested. Confirmation received.  °

## 2021-04-17 ENCOUNTER — Other Ambulatory Visit: Payer: Self-pay | Admitting: Cardiology

## 2021-04-18 MED ORDER — ROSUVASTATIN CALCIUM 10 MG PO TABS: 10.0000 mg | ORAL_TABLET | Freq: Every day | ORAL | 0 refills | Status: DC

## 2021-04-22 NOTE — Progress Notes (Deleted)
?Cardiology Office Note:   ? ?Date:  04/22/2021  ? ?ID:  Emma Barajas, DOB August 16, 1970, MRN [id number] ? ?PCP:  Marva Panda, NP ?  ?[facility] Providers ?Cardiologist:  None {   ? ?Referring MD: Marva Panda, NP  ? ? ?History of Present Illness:   ? ?Emma Barajas is a 51 y.o. female with a hx of HTN and PVCs who presents to clinic for follow-up. Was previously followed by Dr. Delton See. ? ?Per review of the record, event monitor 04/2013 showed frequent PVCs - total 8160 (4.8% of the time). F/u 2D echo 06/2013: EF 60-65%, grade 2 DD.  ? ?Last saw Dr. Delton See in 11/2019 where she was doing great. Remained very active with no significant symptoms. Underweant Ca score 11/2019 which showed Ca score 0. ? ?Today, *** ? ?Past Medical History:  ?Diagnosis Date  ? Essential hypertension   ? HGSIL (high grade squamous intraepithelial dysplasia) 2002  ? LEEP  ? IUD 02-23-10  ? ORIGINALLY INSERTED 01/09/05 AND REPLACE ON 02-23-10  ? PVC's (premature ventricular contractions)   ? a. Event monitor 04/2013 showed frequent PVCs - total 8160 (4.8% of the time). F/u 2D echo 06/2013: EF 60-65%, grade 2 DD.  ? ? ?Past Surgical History:  ?Procedure Laterality Date  ? CERVICAL BIOPSY  W/ LOOP ELECTRODE EXCISION  2002  ? HGSIL  ? INTRAUTERINE DEVICE INSERTION  05/20/2015  ? Liletta  ? LASIX EYE SURGERY  11/2010  ? MOUTH SURGERY    ? WISDOM TOOTH EXTRACTION    ? ? ?Current Medications: ?No outpatient medications have been marked as taking for the 04/25/21 encounter (Appointment) with Meriam Sprague, MD.  ?  ? ?Allergies:   Other  ? ?Social History  ? ?Socioeconomic History  ? Marital status: Married  ?  Spouse name: Not on file  ? Number of children: Not on file  ? Years of education: Not on file  ? Highest education level: Not on file  ?Occupational History  ? Occupation: Airline pilot  ?Tobacco Use  ? Smoking status: Never  ? Smokeless tobacco: Never  ?Vaping Use  ? Vaping Use: Never used  ?Substance and Sexual Activity  ? Alcohol use:  Yes  ?  Alcohol/week: 15.0 standard drinks  ?  Types: 15 Standard drinks or equivalent per week  ? Drug use: No  ? Sexual activity: Yes  ?  Partners: Male  ?  Birth control/protection: I.U.D.  ?  Comment: Penni Bombard 05/2015-1st intercourse 18 yo-5 partners  ?Other Topics Concern  ? Not on file  ?Social History Narrative  ? Not on file  ? ?Social Determinants of Health  ? ?Financial Resource Strain: Not on file  ?Food Insecurity: Not on file  ?Transportation Needs: Not on file  ?Physical Activity: Not on file  ?Stress: Not on file  ?Social Connections: Not on file  ?  ? ?Family History: ?The patient's ***family history includes Atrial fibrillation in her father; Diabetes in her father and paternal grandmother; Heart attack in her maternal grandmother; Heart block in her maternal grandmother and mother; Heart disease in her maternal grandmother; Hyperlipidemia in her father and mother; Hypertension in her brother and father; Prostate cancer in her father; Stroke in her father. There is no history of Stomach cancer, Esophageal cancer, or Colon cancer. ? ?ROS:   ?Please see the history of present illness.    ?*** All other systems reviewed and are negative. ? ?EKGs/Labs/Other Studies Reviewed:   ? ?The following studies were reviewed today: ?Ca score  11/2019: ?FINDINGS: ?Non-cardiac: See separate report from [facility]. ?  ?Ascending Aorta: Normal size, no calcifications. ?  ?Pericardium: Normal. ?  ?Coronary arteries: Normal origin. ?  ?IMPRESSION: ?Coronary calcium score of 0. This was 0 percentile for age and sex ?matched control. ? ?EKG:  EKG is *** ordered today.  The ekg ordered today demonstrates *** ? ?Recent Labs: ?02/18/2021: ALT 12; BUN 15; Creatinine, Ser 0.66; Hemoglobin 12.5; Platelets 227.0; Potassium 4.2; Sodium 140  ?Recent Lipid Panel ?   ?Component Value Date/Time  ? CHOL 220 (H) 11/19/2020 0805  ? TRIG 111 11/19/2020 0805  ? HDL 83 11/19/2020 0805  ? CHOLHDL 2.7 11/19/2020 0805  ? VLDL 18  11/03/2015 0827  ? LDLCALC 115 (H) 11/19/2020 0805  ? ? ? ?Risk Assessment/Calculations:   ?{Does this patient have ATRIAL FIBRILLATION?:[id number]} ? ?    ? ?Physical Exam:   ? ?VS:  There were no vitals taken for this visit.   ? ?Wt Readings from Last 3 Encounters:  ?02/22/21 160 lb (72.6 kg)  ?02/18/21 160 lb (72.6 kg)  ?11/18/20 159 lb (72.1 kg)  ?  ? ?GEN: *** Well nourished, well developed in no acute distress ?HEENT: Normal ?NECK: No JVD; No carotid bruits ?LYMPHATICS: No lymphadenopathy ?CARDIAC: ***RRR, no murmurs, rubs, gallops ?RESPIRATORY:  Clear to auscultation without rales, wheezing or rhonchi  ?ABDOMEN: Soft, non-tender, non-distended ?MUSCULOSKELETAL:  No edema; No deformity  ?SKIN: Warm and dry ?NEUROLOGIC:  Alert and oriented x 3 ?PSYCHIATRIC:  Normal affect  ? ?ASSESSMENT:   ? ?No diagnosis found. ?PLAN:   ? ?In order of problems listed above: ? ?#HTN: ?-Continue amlodipine 5mg  daily ? ?#History of PVCs: ?Controlled. Not on nodal agents ? ?#Family History CAD: ?#HLD: ?-Continue crestor 10mg  daily ?-Ca score 0 ?-Continue lifestyle modifications ? ?   ? ?{Are you ordering a CV Procedure (e.g. stress test, cath, DCCV, TEE, etc)?   Press F2        :  ? ? ?Medication Adjustments/Labs and Tests Ordered: ?Current medicines are reviewed at length with the patient today.  Concerns regarding medicines are outlined above.  ?No orders of the defined types were placed in this encounter. ? ?No orders of the defined types were placed in this encounter. ? ? ?There are no Patient Instructions on file for this visit.  ? ?Signed, ? , MD  ?04/22/2021 11:04 AM    ?[facility] ?

## 2021-04-25 ENCOUNTER — Ambulatory Visit (INDEPENDENT_AMBULATORY_CARE_PROVIDER_SITE_OTHER): Payer: PRIVATE HEALTH INSURANCE | Admitting: Cardiology

## 2021-04-25 ENCOUNTER — Encounter: Payer: Self-pay | Admitting: Cardiology

## 2021-04-25 ENCOUNTER — Other Ambulatory Visit: Payer: Self-pay

## 2021-04-25 VITALS — BP 130/90 | HR 69 | Ht 67.0 in | Wt 161.0 lb

## 2021-04-25 DIAGNOSIS — Z8249 Family history of ischemic heart disease and other diseases of the circulatory system: Secondary | ICD-10-CM

## 2021-04-25 DIAGNOSIS — I10 Essential (primary) hypertension: Secondary | ICD-10-CM

## 2021-04-25 DIAGNOSIS — E785 Hyperlipidemia, unspecified: Secondary | ICD-10-CM | POA: Diagnosis not present

## 2021-04-25 DIAGNOSIS — I493 Ventricular premature depolarization: Secondary | ICD-10-CM

## 2021-04-25 MED ORDER — ROSUVASTATIN CALCIUM 10 MG PO TABS: 10.0000 mg | ORAL_TABLET | Freq: Every day | ORAL | 3 refills | Status: DC

## 2021-04-25 NOTE — Patient Instructions (Signed)
Medication Instructions:  ?Your physician recommends that you continue on your current medications as directed. Please refer to the Current Medication list given to you today. ? ?*If you need a refill on your cardiac medications before your next appointment, please call your pharmacy* ? ?Lab Work: ?NONE ?If you have labs (blood work) drawn today and your tests are completely normal, you will receive your results only by: ?MyChart Message (if you have MyChart) OR ?A paper copy in the mail ?If you have any lab test that is abnormal or we need to change your treatment, we will call you to review the results. ? ?Testing/Procedures: ?NONE ? ?Follow-Up: ?At [facility], you and your health needs are our priority.  As part of our continuing mission to provide you with exceptional heart care, we have created designated Provider Care Teams.  These Care Teams include your primary Cardiologist (physician) and Advanced Practice Providers (APPs -  Physician Assistants and Nurse Practitioners) who all work together to provide you with the care you need, when you need it. ? ?Your next appointment:   ?2 year(s) ? ?The format for your next appointment:   ?In Person ? ?Provider:   ?Laurance Flatten, MD ?

## 2021-04-25 NOTE — Progress Notes (Signed)
?Cardiology Office Note:   ? ?Date:  04/25/2021  ? ?ID:  Emma Barajas, DOB 27-Nov-1970, MRN [id number] ? ?PCP:  Marva PandaMillsaps, Kimberly, NP ?  ?[facility] Providers ?Cardiologist:  None {   ? ?Referring MD: Marva PandaMillsaps, Kimberly, NP  ? ? ?History of Present Illness:   ? ?Emma Barajas is a 51 y.o. female with a hx of HTN and PVCs who presents to clinic for follow-up. Was previously followed by Dr. Delton SeeNelson. ? ?Per review of the record, event monitor 04/2013 showed frequent PVCs - total 8160 (4.8% of the time). F/u 2D echo 06/2013: EF 60-65%, grade 2 DD.  ? ?Last saw Dr. Delton SeeNelson in 11/2019 where she was doing great. Remained very active with no significant symptoms. Underweant Ca score 11/2019 which showed Ca score 0. ? ?Today, she is doing well. She has not noticed recent episodes of palpitations and denies any chest pain, SOB, nausea, vomiting, diaphoresis. She is very active without anginal symptoms. For activity, she enjoys hiking. Blood pressure is well controlled running mainly 120s over 80s. She is compliant with medications without issues. Last LDL 115 on crestor 10mg  daily. ? ?Her maternal grandmother had cardiovascular disease starting in her 7540s. Both of her parents were diagnosed with cardiovascular disease in their 5480s. ? ?She denies chest pain, chest pressure, dyspnea at rest or with exertion, PND, orthopnea, or leg swelling. Denies cough, fever, chills. Denies nausea, vomiting. Denies syncope or presyncope. Denies dizziness or lightheadedness. Denies snoring. ? ?Past Medical History:  ?Diagnosis Date  ? Essential hypertension   ? HGSIL (high grade squamous intraepithelial dysplasia) 2002  ? LEEP  ? IUD 02-23-10  ? ORIGINALLY INSERTED 01/09/05 AND REPLACE ON 02-23-10  ? PVC's (premature ventricular contractions)   ? a. Event monitor 04/2013 showed frequent PVCs - total 8160 (4.8% of the time). F/u 2D echo 06/2013: EF 60-65%, grade 2 DD.  ? ? ?Past Surgical History:  ?Procedure Laterality Date  ? CERVICAL BIOPSY  W/  LOOP ELECTRODE EXCISION  2002  ? HGSIL  ? INTRAUTERINE DEVICE INSERTION  05/20/2015  ? Liletta  ? LASIX EYE SURGERY  11/2010  ? MOUTH SURGERY    ? WISDOM TOOTH EXTRACTION    ? ? ?Current Medications: ?Current Meds  ?Medication Sig  ? amLODipine (NORVASC) 5 MG tablet Take 1 tablet (5 mg total) by mouth daily. Please keep upcoming appt in March 2023 with Dr. Shari ProwsPemberton new Cardiologist before anymore refills. Thank you  ? B Complex-C (SUPER B COMPLEX PO) Take 1 tablet by mouth daily.   ? Black Cohosh-SoyIsoflav-C Quad (ESTROVEN MENOPAUSE & WEIGHT PO)   ? cetirizine (ZYRTEC) 10 MG tablet Take 10 mg by mouth daily.  ? Coenzyme Q10 (COQ10) 200 MG CAPS   ? Levonorgestrel (LILETTA, 52 MG, IU) by Intrauterine route.  ? Magnesium 500 MG TABS   ? Multiple Vitamins-Minerals (WOMENS 50+ MULTI VITAMIN/MIN) TABS   ? Omega-3 Fatty Acids (FISH OIL PO) Take 1 capsule by mouth daily.   ? pantoprazole (PROTONIX) 40 MG tablet Take 1 tablet (40 mg total) by mouth daily.  ? Safflower Oil (CLA) 1000 MG CAPS   ? valACYclovir (VALTREX) 1000 MG tablet Take 1,000 mg by mouth daily as needed (cold sores).   ? [DISCONTINUED] rosuvastatin (CRESTOR) 10 MG tablet Take 1 tablet (10 mg total) by mouth daily. Please keep upcoming appt in March 2023 with Dr. Shari ProwsPemberton new Cardiologist before anymore refills. Thank you  ?  ? ?Allergies:   Other  ? ?Social History  ? ?  Socioeconomic History  ? Marital status: Married  ?  Spouse name: Not on file  ? Number of children: Not on file  ? Years of education: Not on file  ? Highest education level: Not on file  ?Occupational History  ? Occupation: Airline pilot  ?Tobacco Use  ? Smoking status: Never  ? Smokeless tobacco: Never  ?Vaping Use  ? Vaping Use: Never used  ?Substance and Sexual Activity  ? Alcohol use: Yes  ?  Alcohol/week: 15.0 standard drinks  ?  Types: 15 Standard drinks or equivalent per week  ? Drug use: No  ? Sexual activity: Yes  ?  Partners: Male  ?  Birth control/protection: I.U.D.  ?  Comment: Penni Bombard  05/2015-1st intercourse 18 yo-5 partners  ?Other Topics Concern  ? Not on file  ?Social History Narrative  ? Not on file  ? ?Social Determinants of Health  ? ?Financial Resource Strain: Not on file  ?Food Insecurity: Not on file  ?Transportation Needs: Not on file  ?Physical Activity: Not on file  ?Stress: Not on file  ?Social Connections: Not on file  ?  ? ?Family History: ?The patient's family history includes Atrial fibrillation in her father; Diabetes in her father and paternal grandmother; Heart attack in her maternal grandmother; Heart block in her maternal grandmother and mother; Heart disease in her maternal grandmother; Hyperlipidemia in her father and mother; Hypertension in her brother and father; Prostate cancer in her father; Stroke in her father. There is no history of Stomach cancer, Esophageal cancer, or Colon cancer. ? ?ROS:   ?Please see the history of present illness.    ?Review of Systems  ?Constitutional:  Negative for chills and fever.  ?HENT:  Negative for congestion and sore throat.   ?Eyes:  Negative for blurred vision and discharge.  ?Respiratory:  Negative for cough and shortness of breath.   ?Cardiovascular:  Negative for chest pain, palpitations, orthopnea, claudication, leg swelling and PND.  ?Gastrointestinal:  Negative for heartburn and nausea.  ?Genitourinary:  Negative for dysuria and urgency.  ?Musculoskeletal:  Negative for joint pain and myalgias.  ?Skin:  Negative for itching and rash.  ?Neurological:  Negative for dizziness and headaches.  ?Endo/Heme/Allergies:  Does not bruise/bleed easily.  ?Psychiatric/Behavioral:  The patient is not nervous/anxious and does not have insomnia.   ?All other systems reviewed and are negative. ? ?EKGs/Labs/Other Studies Reviewed:   ? ?The following studies were reviewed today: ?Ca score 11/2019: ?FINDINGS: ?Non-cardiac: See separate report from [facility]. ?  ?Ascending Aorta: Normal size, no calcifications. ?  ?Pericardium: Normal. ?   ?Coronary arteries: Normal origin. ?  ?IMPRESSION: ?Coronary calcium score of 0. This was 0 percentile for age and sex ?matched control. ? ?EKG:   ?04/25/21: Sinus rhythm, rate 69 bpm ? ?Recent Labs: ?02/18/2021: ALT 12; BUN 15; Creatinine, Ser 0.66; Hemoglobin 12.5; Platelets 227.0; Potassium 4.2; Sodium 140  ?Recent Lipid Panel ?   ?Component Value Date/Time  ? CHOL 220 (H) 11/19/2020 0805  ? TRIG 111 11/19/2020 0805  ? HDL 83 11/19/2020 0805  ? CHOLHDL 2.7 11/19/2020 0805  ? VLDL 18 11/03/2015 0827  ? LDLCALC 115 (H) 11/19/2020 0805  ? ? ? ?Risk Assessment/Calculations:   ?  ? ?    ? ?Physical Exam:   ? ?VS:  BP 130/90   Pulse 69   Ht 5\' 7"  (1.702 m)   Wt 161 lb (73 kg)   SpO2 99%   BMI 25.22 kg/m?    ? ?Wt Readings  from Last 3 Encounters:  ?04/25/21 161 lb (73 kg)  ?02/22/21 160 lb (72.6 kg)  ?02/18/21 160 lb (72.6 kg)  ?  ? ?GEN: Well nourished, well developed in no acute distress ?HEENT: Normal ?NECK: No JVD; No carotid bruits ?LYMPHATICS: No lymphadenopathy ?CARDIAC: RRR, no murmurs, rubs, gallops ?RESPIRATORY:  Clear to auscultation without rales, wheezing or rhonchi  ?ABDOMEN: Soft, non-tender, non-distended ?MUSCULOSKELETAL:  No edema; No deformity  ?SKIN: Warm and dry ?NEUROLOGIC:  Alert and oriented x 3 ?PSYCHIATRIC:  Normal affect  ? ?ASSESSMENT:   ? ?1. PVC's (premature ventricular contractions)   ?2. Family history of early CAD   ?3. Essential hypertension   ?4. Hyperlipidemia, unspecified hyperlipidemia type   ? ?PLAN:   ? ?In order of problems listed above: ? ?#HTN: ?Well controlled and at goal <120/80s. ?-Continue amlodipine 5mg  daily ? ?#History of PVCs: ?Controlled. Not on nodal agents. No symptoms ? ?#Family History CAD: ?#HLD: ?-Continue crestor 10mg  daily ?-Re-check lipids in 6 weeks; ideally LDL<70 given family history ?-Ca score 0 ?-Continue lifestyle modifications ? ?   ? ?Medication Adjustments/Labs and Tests Ordered: ?Current medicines are reviewed at length with the patient today.   Concerns regarding medicines are outlined above.  ?Orders Placed This Encounter  ?Procedures  ? Lipid panel  ? EKG 12-Lead  ? ?Meds ordered this encounter  ?Medications  ? rosuvastatin (CRESTOR) 10 MG tablet

## 2021-05-03 ENCOUNTER — Other Ambulatory Visit: Payer: Self-pay | Admitting: Cardiology

## 2021-05-03 MED ORDER — AMLODIPINE BESYLATE 5 MG PO TABS: 5.0000 mg | ORAL_TABLET | Freq: Every day | ORAL | 3 refills | Status: DC

## 2021-10-19 ENCOUNTER — Other Ambulatory Visit: Payer: Self-pay

## 2021-10-19 ENCOUNTER — Encounter (HOSPITAL_COMMUNITY): Payer: Self-pay | Admitting: Emergency Medicine

## 2021-10-19 ENCOUNTER — Ambulatory Visit (HOSPITAL_COMMUNITY)
Admission: EM | Admit: 2021-10-19 | Discharge: 2021-10-19 | Disposition: A | Discharge status: Home / Self Care | Payer: PRIVATE HEALTH INSURANCE

## 2021-10-19 DIAGNOSIS — S8012XA Contusion of left lower leg, initial encounter: Secondary | ICD-10-CM | POA: Diagnosis not present
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
## 2021-10-19 NOTE — Discharge Instructions (Addendum)
Your bruises should get better with Rest, Ice, Compression, Elevation Aleve or Tylenol as needed for pain  It was nice meeting you today!

## 2021-10-19 NOTE — ED Triage Notes (Signed)
Patient is going to the beach the next two weekends and flying to [place] in a month.

## 2021-11-10 ENCOUNTER — Telehealth: Payer: Self-pay | Admitting: Cardiology

## 2021-11-10 ENCOUNTER — Other Ambulatory Visit: Payer: Self-pay

## 2021-11-10 MED ORDER — AMLODIPINE BESYLATE 5 MG PO TABS: 5.0000 mg | ORAL_TABLET | Freq: Every day | ORAL | 1 refills | Status: DC

## 2021-11-10 MED ORDER — ROSUVASTATIN CALCIUM 10 MG PO TABS: 10.0000 mg | ORAL_TABLET | Freq: Every day | ORAL | 1 refills | Status: DC

## 2021-11-10 NOTE — Telephone Encounter (Signed)
*  STAT* If patient is at the pharmacy, call can be transferred to refill team.   1. Which medications need to be refilled? (please list name of each medication and dose if known) new prescriptions for Amlodipine and Rosuvastatin  2. Which pharmacy/location (including street and city if local pharmacy) is medication to be sent to?[place]  3. Do they need a 30 day or 90 day supply? 90 days and refills

## 2021-11-10 NOTE — Telephone Encounter (Signed)
Pt's medications were sent to pt's pharmacy as requested. Confirmation received.  

## 2021-11-21 ENCOUNTER — Encounter: Payer: Self-pay | Admitting: Nurse Practitioner

## 2021-11-21 ENCOUNTER — Other Ambulatory Visit (HOSPITAL_COMMUNITY)
Admission: RE | Admit: 2021-11-21 | Discharge: 2021-11-21 | Disposition: A | Discharge status: Home / Self Care | Payer: PRIVATE HEALTH INSURANCE | Source: Ambulatory Visit | Attending: Nurse Practitioner | Admitting: Nurse Practitioner

## 2021-11-21 ENCOUNTER — Ambulatory Visit (INDEPENDENT_AMBULATORY_CARE_PROVIDER_SITE_OTHER): Payer: PRIVATE HEALTH INSURANCE | Admitting: Nurse Practitioner

## 2021-11-21 VITALS — BP 98/62 | HR 74 | Ht 66.75 in | Wt 162.0 lb

## 2021-11-21 DIAGNOSIS — Z30431 Encounter for routine checking of intrauterine contraceptive device: Secondary | ICD-10-CM | POA: Diagnosis not present

## 2021-11-21 DIAGNOSIS — Z833 Family history of diabetes mellitus: Secondary | ICD-10-CM

## 2021-11-21 DIAGNOSIS — E785 Hyperlipidemia, unspecified: Secondary | ICD-10-CM | POA: Diagnosis not present

## 2021-11-21 DIAGNOSIS — Z01419 Encounter for gynecological examination (general) (routine) without abnormal findings: Secondary | ICD-10-CM | POA: Diagnosis present

## 2021-11-21 NOTE — Progress Notes (Signed)
   Emma Barajas 1970/11/12 [id number]   History:  51 y.o. G0 presents for annual exam. Liletta IUD 05/2015, amenorrheic. Having some menopausal symptoms, good management with OTC supplement. [place] 55 one year ago. 2002 LEEP, subsequent paps normal. HLD managed by PCP.  No LMP recorded. (Menstrual status: IUD).   Contraception: IUD Sexually active: Yes  Health maintenance Last Pap: 11/06/2016. Results were: Normal neg HPV Last mammogram: 12/16/2020. Results were: Normal Last colonoscopy: 02/22/2021. Results were: Normal, 10-year recall Last Dexa:Not indicated  Past medical history, past surgical history, family history and social history were all reviewed and documented in the EPIC chart. Married. Owns custom pool company. Going to [place] next week.   ROS:  A ROS was performed and pertinent positives and negatives are included.  Exam:  Vitals:   11/21/21 0858  BP: 98/62  Pulse: 74  SpO2: 98%  Weight: 162 lb (73.5 kg)  Height: 5' 6.75" (1.695 m)     Body mass index is 25.56 kg/m.  General appearance:  Normal Thyroid:  Symmetrical, normal in size, without palpable masses or nodularity. Respiratory  Auscultation:  Expiratory wheezing in left lobes Cardiovascular  Auscultation:  Regular rate, without rubs, murmurs or gallops  Edema/varicosities:  Not grossly evident Abdominal  Soft,nontender, without masses, guarding or rebound.  Liver/spleen:  No organomegaly noted  Hernia:  None appreciated  Skin  Inspection:  Grossly normal   Breasts: Examined lying and sitting.   Right: Without masses, retractions, discharge or axillary adenopathy.   Left: Without masses, retractions, discharge or axillary adenopathy. Genitourinary   Inguinal/mons:  Normal without inguinal adenopathy  External genitalia:  Normal appearing vulva with no masses, tenderness, or lesions  BUS/Urethra/Skene's glands:  Normal  Vagina:  Normal appearing with normal color and discharge, no lesions  Cervix:   Normal appearing without discharge or lesions. IUD string visible  Uterus:  Normal in size, shape and contour.  Midline and mobile, nontender  Adnexa/parametria:     Rt: Normal in size, without masses or tenderness.   Lt: Normal in size, without masses or tenderness.  Anus and perineum: Normal  Digital rectal exam: Normal sphincter tone without palpated masses or tenderness  Patient informed chaperone available to be present for breast and pelvic exam. Patient has requested no chaperone to be present. Patient has been advised what will be completed during breast and pelvic exam.   Assessment/Plan:  51 y.o. G0 for annual exam.   Well female exam with routine gynecological exam - Education provided on SBEs, importance of preventative screenings, current guidelines, high calcium diet, regular exercise, and multivitamin daily. Requesting labs today.   Encounter for routine checking of intrauterine contraceptive device (IUD) - Liletta IUD 05/2015, amenorrheic. Aware of 8-year FDA approval.   Hyperlipidemia, unspecified hyperlipidemia type - Plan: Lipid panel. Rosuvastatin prescribed by PCP.   Family history of diabetes mellitus - Plan: Hemoglobin A1c.  Screening for cervical cancer - 2002 LEEP, subsequent paps normal. Pap today.   Screening for breast cancer - Normal mammogram history.  Continue annual screenings.  Normal breast exam today.  Screening for colon cancer - 02/2021 colonoscopy. Will repeat at 10-year interval per GI recommendation.   Follow-up in 1 year for annual.      Tamela Gammon [facility], 9:24 AM 11/21/2021

## 2021-11-22 LAB — COMPREHENSIVE METABOLIC PANEL
AG Ratio: 1.6 (calc) (ref 1.0–2.5)
ALT: 11 U/L (ref 6–29)
AST: 19 U/L (ref 10–35)
Albumin: 4.7 g/dL (ref 3.6–5.1)
Alkaline phosphatase (APISO): 77 U/L (ref 37–153)
BUN: 16 mg/dL (ref 7–25)
CO2: 28 mmol/L (ref 20–32)
Calcium: 9.4 mg/dL (ref 8.6–10.4)
Chloride: 104 mmol/L (ref 98–110)
Creat: 0.65 mg/dL (ref 0.50–1.03)
Globulin: 2.9 g/dL (calc) (ref 1.9–3.7)
Glucose, Bld: 73 mg/dL (ref 65–99)
Potassium: 5.4 mmol/L — ABNORMAL HIGH (ref 3.5–5.3)
Sodium: 139 mmol/L (ref 135–146)
Total Bilirubin: 0.5 mg/dL (ref 0.2–1.2)
Total Protein: 7.6 g/dL (ref 6.1–8.1)

## 2021-11-22 LAB — CBC WITH DIFFERENTIAL/PLATELET
Absolute Monocytes: 402 cells/uL (ref 200–950)
Basophils Absolute: 50 cells/uL (ref 0–200)
Basophils Relative: 0.9 %
Eosinophils Absolute: 88 cells/uL (ref 15–500)
Eosinophils Relative: 1.6 %
HCT: 41.1 % (ref 35.0–45.0)
Hemoglobin: 13.6 g/dL (ref 11.7–15.5)
Lymphs Abs: 1689 cells/uL (ref 850–3900)
MCH: 29.9 pg (ref 27.0–33.0)
MCHC: 33.1 g/dL (ref 32.0–36.0)
MCV: 90.3 fL (ref 80.0–100.0)
MPV: 10.2 fL (ref 7.5–12.5)
Monocytes Relative: 7.3 %
Neutro Abs: 3273 cells/uL (ref 1500–7800)
Neutrophils Relative %: 59.5 %
Platelets: 206 10*3/uL (ref 140–400)
RBC: 4.55 10*6/uL (ref 3.80–5.10)
RDW: 13 % (ref 11.0–15.0)
Total Lymphocyte: 30.7 %
WBC: 5.5 10*3/uL (ref 3.8–10.8)

## 2021-11-22 LAB — LIPID PANEL
Cholesterol: 195 mg/dL (ref <200)
HDL: 89 mg/dL (ref >=50)
LDL Cholesterol (Calc): 78 mg/dL (calc)
Non-HDL Cholesterol (Calc): 106 mg/dL (calc) (ref <130)
Total CHOL/HDL Ratio: 2.2 (calc) (ref <5.0)
Triglycerides: 184 mg/dL — ABNORMAL HIGH (ref <150)

## 2021-11-22 LAB — HEMOGLOBIN A1C
Hgb A1c MFr Bld: 5.6 % of total Hgb (ref <5.7)
Mean Plasma Glucose: 114 mg/dL
eAG (mmol/L): 6.3 mmol/L

## 2021-11-23 LAB — CYTOLOGY - PAP
Comment: NEGATIVE
Diagnosis: UNDETERMINED — AB
High risk HPV: NEGATIVE

## 2022-01-02 ENCOUNTER — Encounter: Payer: Self-pay | Admitting: Nurse Practitioner

## 2022-02-17 ENCOUNTER — Encounter: Payer: Self-pay | Admitting: Nurse Practitioner

## 2022-02-20 NOTE — Telephone Encounter (Signed)
Pt contacted for symptom update: Pt stated that she noticed a very small amount of blood mixed in with her stool on Thursday and Friday, none over the weekend and a tiny amount this AM. Pt has no other symptoms: No pain, nausea, abdominal pain, dizziness, SOB, Chest pain. Pt notified to call our office if the bleeding increases or has any symptoms: Pt scheduled for an office visit on 02/23/2022 at 1:30 PM to see Carl Best NP: Pt made aware. Pt verbalized understanding with all questions answered.

## 2022-02-23 ENCOUNTER — Ambulatory Visit: Payer: PRIVATE HEALTH INSURANCE | Admitting: Nurse Practitioner

## 2022-04-21 ENCOUNTER — Other Ambulatory Visit: Payer: Self-pay | Admitting: Cardiology

## 2022-04-21 DIAGNOSIS — E785 Hyperlipidemia, unspecified: Secondary | ICD-10-CM

## 2022-05-01 ENCOUNTER — Other Ambulatory Visit: Payer: Self-pay | Admitting: *Deleted

## 2022-05-01 MED ORDER — AMLODIPINE BESYLATE 5 MG PO TABS: 5.0000 mg | ORAL_TABLET | Freq: Every day | ORAL | 3 refills | Status: DC

## 2022-06-09 IMAGING — CT CT ABD-PELV W/ CM
2 of 5 series · 15 of 46 positions shown, 17 images · IV contrast (OMNIPAQUE 300)
Comparison: None.

CLINICAL DATA: Intermittent mid abdominal and bilateral upper
quadrant pain since 4214.

EXAM:
CT ABDOMEN AND PELVIS WITH CONTRAST
TECHNIQUE: Multidetector CT imaging of the abdomen and pelvis was performed
using the standard protocol following bolus administration of
intravenous contrast.

[Series 2: abd/pel w · axial · 0.70mm/px · z∈[-510,-100]mm · 12 of 92 slices shown, 14 images]
[im 5/92  soft-tissue]
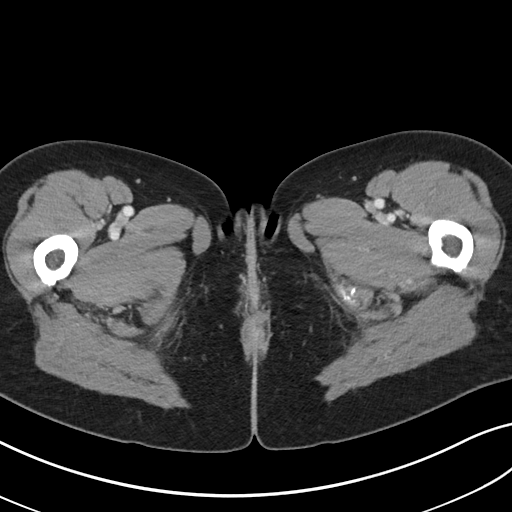
[im 5/92  bone]
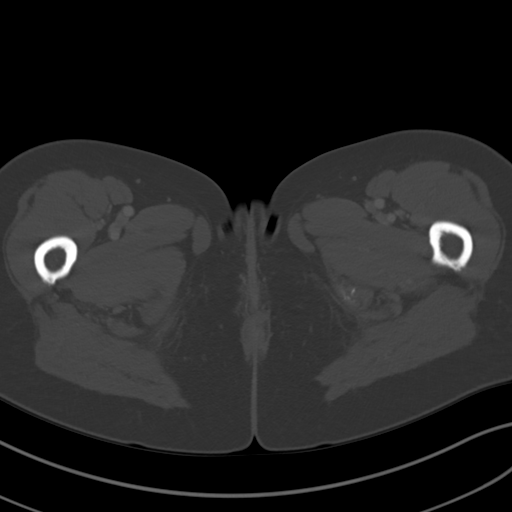
[im 14/92  soft-tissue]
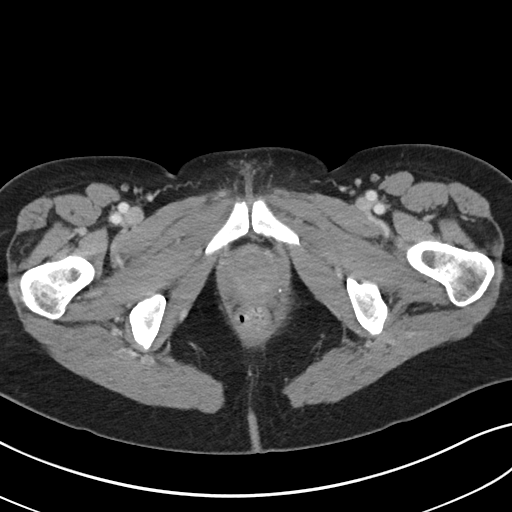
[im 19/92  soft-tissue]
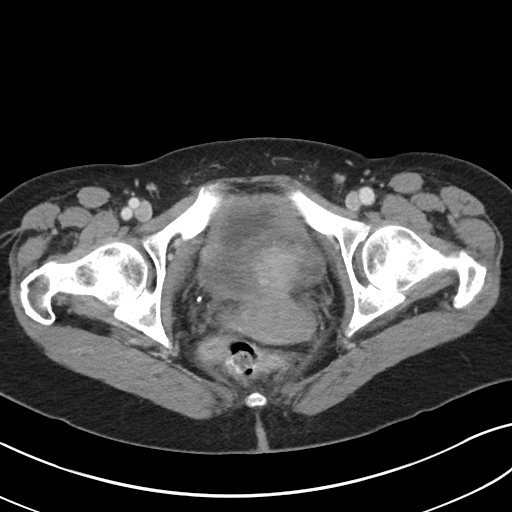
[im 28/92  soft-tissue]
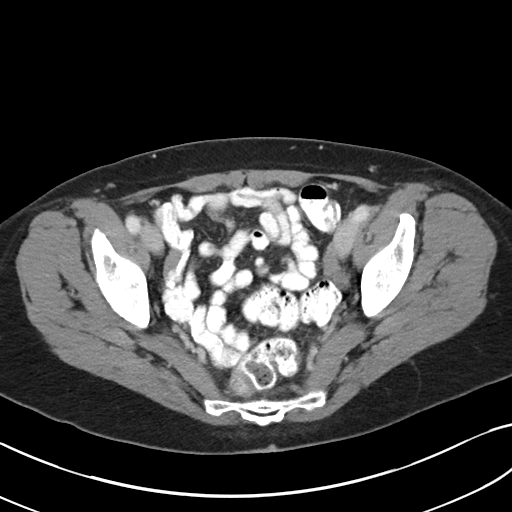
[im 37/92  soft-tissue]
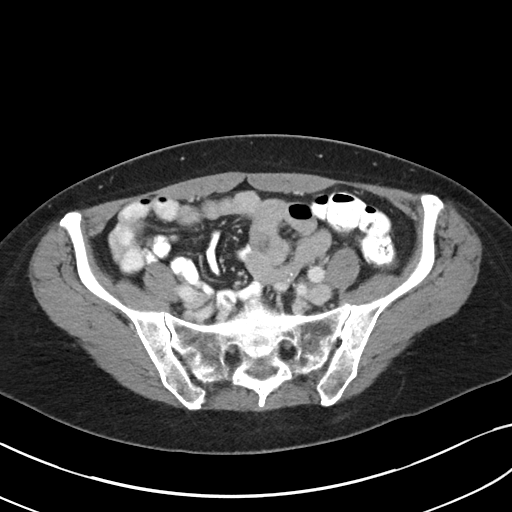
[im 41/92  soft-tissue]
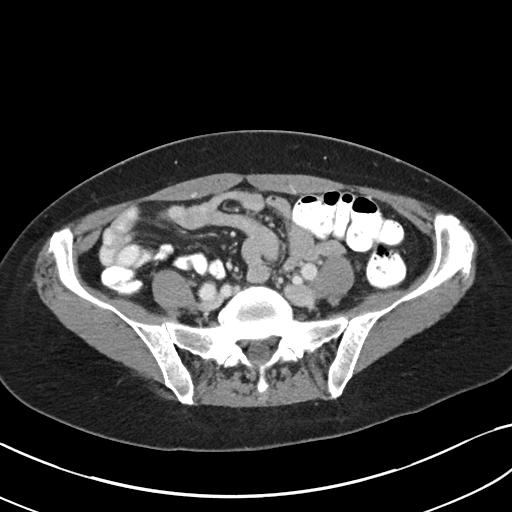
[im 51/92  soft-tissue]
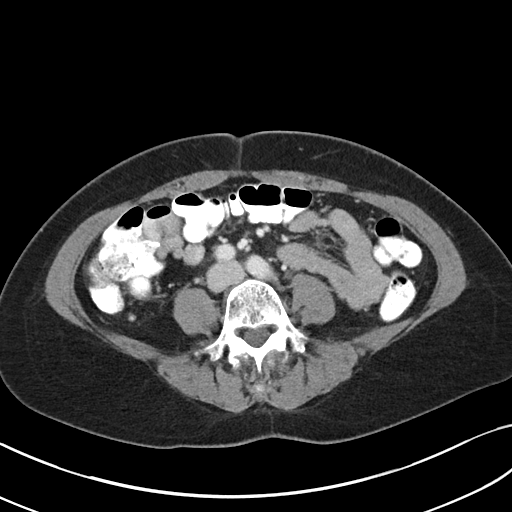
[im 55/92  soft-tissue]
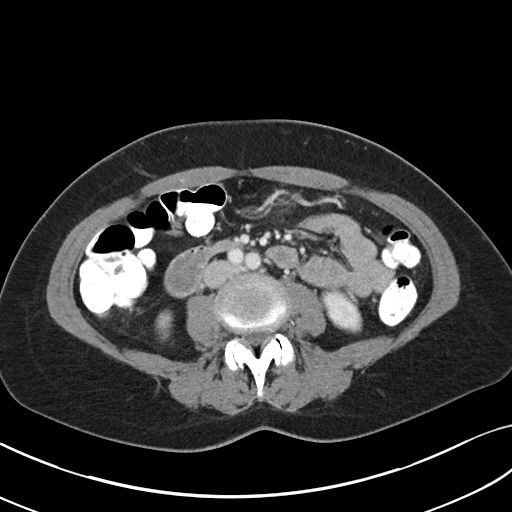
[im 64/92  soft-tissue]
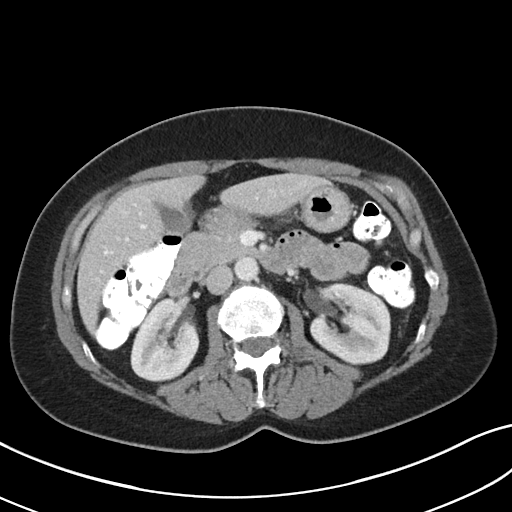
[im 64/92  bone]
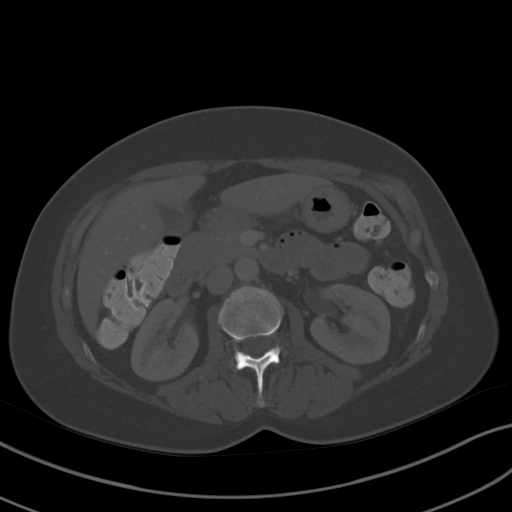
[im 73/92  soft-tissue]
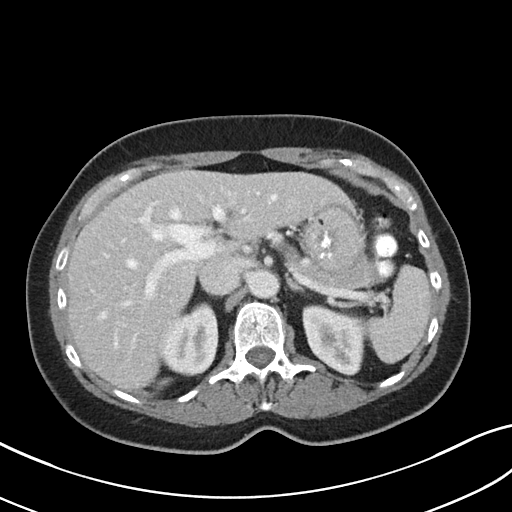
[im 78/92  soft-tissue]
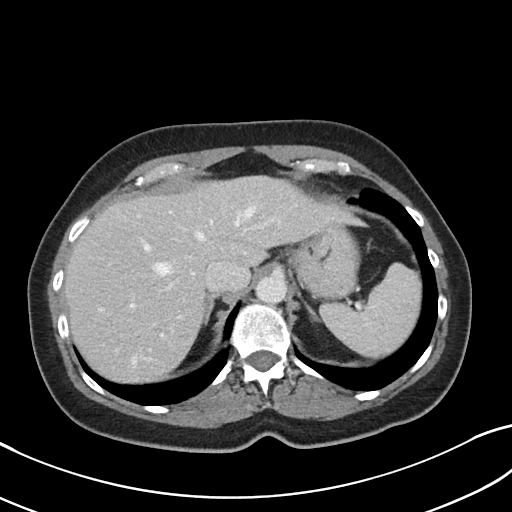
[im 87/92  soft-tissue]
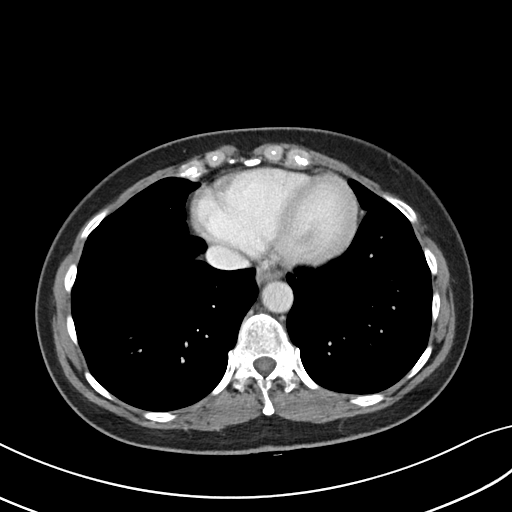

[Series 5: coronal st · coronal · 0.69mm/px · 3 of 71 slices shown]
[im 24/71  soft-tissue]
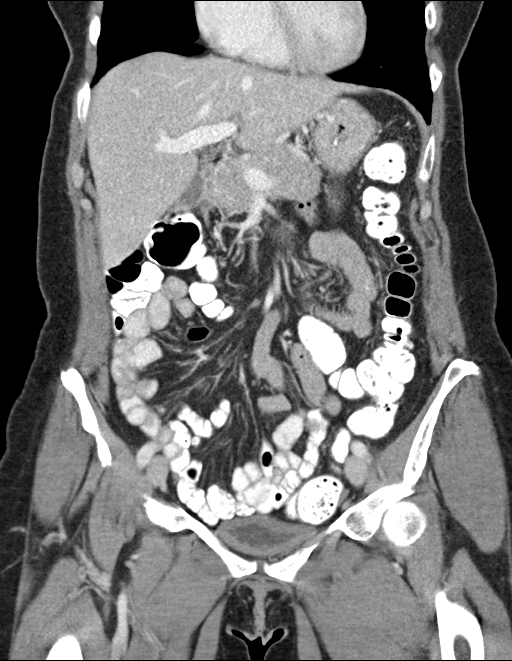
[im 32/71  soft-tissue]
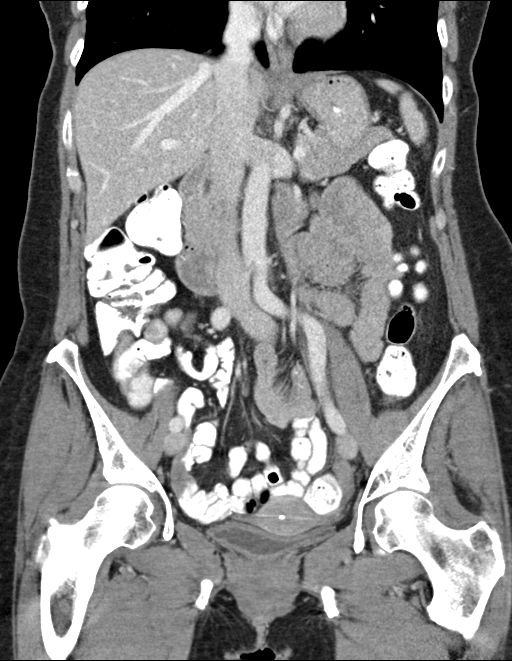
[im 39/71  soft-tissue]
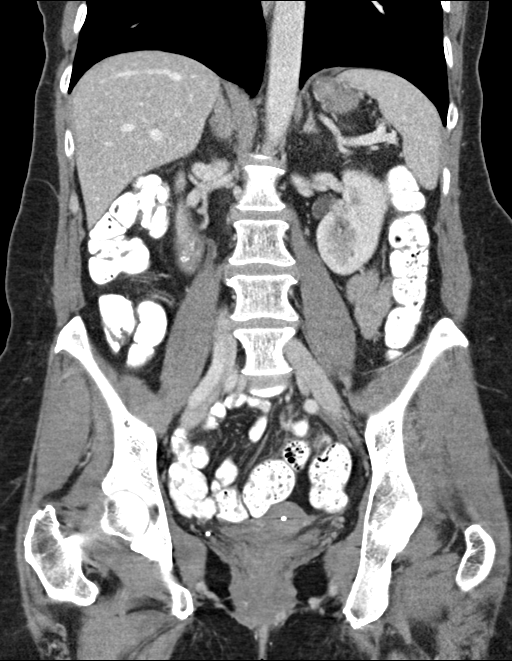

[15 of 46 positions shown; findings below may reference images not displayed]

RADIATION DOSE REDUCTION: This exam was performed according to the
departmental dose-optimization program which includes automated
exposure control, adjustment of the mA and/or kV according to
patient size and/or use of iterative reconstruction technique.

CONTRAST:  100mL OMNIPAQUE IOHEXOL 300 MG/ML  SOLN
FINDINGS: Lower chest: No acute abnormality.

Hepatobiliary: No suspicious hepatic lesion. Subcentimeter hypodense
lesion in the posterior right hepatic lobe on image [DATE] is
technically too small to accurately characterize but statistically
likely to reflect a benign etiology such as a cyst or hemangioma.
Gallbladder is unremarkable. No biliary ductal dilation.

Pancreas: Unremarkable. No pancreatic ductal dilatation or
surrounding inflammatory changes.

Spleen: Normal in size without focal abnormality.

Adrenals/Urinary Tract: Bilateral adrenal glands are unremarkable.
No hydronephrosis. No solid enhancing renal mass. Kidneys
demonstrate symmetric enhancement and excretion of contrast
material. Mild symmetric wall thickening of an incompletely
distended urinary bladder.

Stomach/Bowel: Radiopaque enteric contrast material traverses the
sigmoid colon. Stomach is decompressed limiting evaluation. No
pathologic dilation of large or small bowel. The appendix and
terminal appear. No evidence of acute bowel inflammation. No
suspicious colonic wall thickening or mass like lesions identified.

Vascular/Lymphatic: No significant vascular findings are present. No
pathologically enlarged abdominal or pelvic lymph nodes.

Reproductive: Intrauterine device appears appropriate in
positioning. No suspicious adnexal mass.

Other: No significant abdominopelvic free fluid.

Musculoskeletal: Lumbar spondylosis.  No acute osseous abnormality.
IMPRESSION: 1. Mild symmetric wall thickening of an incompletely distended
urinary bladder. Correlate with urinalysis to exclude cystitis.
2. Otherwise, no acute abdominopelvic findings.

## 2022-08-08 DIAGNOSIS — E785 Hyperlipidemia, unspecified: Secondary | ICD-10-CM | POA: Diagnosis not present

## 2022-08-08 DIAGNOSIS — R5383 Other fatigue: Secondary | ICD-10-CM | POA: Diagnosis not present

## 2022-08-08 DIAGNOSIS — L659 Nonscarring hair loss, unspecified: Secondary | ICD-10-CM | POA: Diagnosis not present

## 2022-11-04 DIAGNOSIS — J209 Acute bronchitis, unspecified: Secondary | ICD-10-CM | POA: Diagnosis not present

## 2022-11-06 DIAGNOSIS — J019 Acute sinusitis, unspecified: Secondary | ICD-10-CM | POA: Diagnosis not present

## 2022-11-20 DIAGNOSIS — E559 Vitamin D deficiency, unspecified: Secondary | ICD-10-CM | POA: Diagnosis not present

## 2022-11-20 DIAGNOSIS — E279 Disorder of adrenal gland, unspecified: Secondary | ICD-10-CM | POA: Diagnosis not present

## 2022-11-20 DIAGNOSIS — R5383 Other fatigue: Secondary | ICD-10-CM | POA: Diagnosis not present

## 2022-11-20 DIAGNOSIS — E782 Mixed hyperlipidemia: Secondary | ICD-10-CM | POA: Diagnosis not present

## 2022-11-20 DIAGNOSIS — R739 Hyperglycemia, unspecified: Secondary | ICD-10-CM | POA: Diagnosis not present

## 2022-11-20 DIAGNOSIS — E7210 Disorders of sulfur-bearing amino-acid metabolism, unspecified: Secondary | ICD-10-CM | POA: Diagnosis not present

## 2022-11-20 DIAGNOSIS — R7989 Other specified abnormal findings of blood chemistry: Secondary | ICD-10-CM | POA: Diagnosis not present

## 2023-01-02 DIAGNOSIS — Z1231 Encounter for screening mammogram for malignant neoplasm of breast: Secondary | ICD-10-CM | POA: Diagnosis not present

## 2023-01-03 ENCOUNTER — Encounter: Payer: Self-pay | Admitting: Nurse Practitioner

## 2023-01-16 ENCOUNTER — Ambulatory Visit: Payer: BC Managed Care – PPO | Admitting: Nurse Practitioner

## 2023-03-13 ENCOUNTER — Ambulatory Visit (INDEPENDENT_AMBULATORY_CARE_PROVIDER_SITE_OTHER): Payer: BC Managed Care – PPO | Admitting: Nurse Practitioner

## 2023-03-13 ENCOUNTER — Encounter: Payer: Self-pay | Admitting: Nurse Practitioner

## 2023-03-13 VITALS — BP 126/78 | HR 76 | Ht 67.0 in | Wt 166.0 lb

## 2023-03-13 DIAGNOSIS — N912 Amenorrhea, unspecified: Secondary | ICD-10-CM | POA: Diagnosis not present

## 2023-03-13 DIAGNOSIS — Z01419 Encounter for gynecological examination (general) (routine) without abnormal findings: Secondary | ICD-10-CM

## 2023-03-13 DIAGNOSIS — Z30431 Encounter for routine checking of intrauterine contraceptive device: Secondary | ICD-10-CM

## 2023-03-13 NOTE — Progress Notes (Signed)
 Emma Barajas 07-13-1970 [id number]   History:  53 y.o. G0 presents for annual exam. Liletta  IUD 05/2015, amenorrheic. Having some mild menopausal symptoms, good management with OTC supplement and lifestyle changes. 2002 LEEP, subsequent paps normal. HLD managed by PCP.  No LMP recorded. (Menstrual status: IUD).   Contraception: IUD Sexually active: Yes  Health maintenance Last Pap: 11/21/2021. Results were: ASCUS neg HPV, 3-year recall Last mammogram: 01/02/2023. Results were: Normal Last colonoscopy: 02/22/2021. Results were: Normal, 10-year recall Last Dexa: 2002 Exercising: Yes. Swim, hike, yoga  Smoker: no   Past medical history, past surgical history, family history and social history were all reviewed and documented in the EPIC chart. 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   ROS:  A ROS was performed and pertinent positives and negatives are included.  Exam:  Vitals:   03/13/23 0805  BP: 126/78  Pulse: 76  SpO2: 99%  Weight: 166 lb (75.3 kg)  Height: 5' 7 (1.702 m)      Body mass index is 26 kg/m.  General appearance:  Normal Thyroid :  Symmetrical, normal in size, without palpable masses or nodularity. Respiratory  Auscultation:  Expiratory wheezing in left lobes Cardiovascular  Auscultation:  Regular rate, without rubs, murmurs or gallops  Edema/varicosities:  Not grossly evident Abdominal  Soft,nontender, without masses, guarding or rebound.  Liver/spleen:  No organomegaly noted  Hernia:  None appreciated  Skin  Inspection:  Grossly normal   Breasts: Examined lying and sitting.   Right: Without masses, retractions, discharge or axillary adenopathy.   Left: Without masses, retractions, discharge or axillary adenopathy. Pelvic: External genitalia:  no lesions              Urethra:  normal appearing urethra with no masses, tenderness or lesions              Bartholins and Skenes: normal                 Vagina: normal appearing vagina with  normal color and discharge, no lesions              Cervix: no lesions. IUD string visible Bimanual Exam:  Uterus:  no masses or tenderness              Adnexa: no mass, fullness, tenderness              Rectovaginal: Deferred              Anus:  normal, no lesions  Patient informed chaperone available to be present for breast and pelvic exam. Patient has requested no chaperone to be present. Patient has been advised what will be completed during breast and pelvic exam.   Assessment/Plan:  53 y.o. G0 for annual exam.   Well female exam with routine gynecological exam - Education provided on SBEs, importance of preventative screenings, current guidelines, high calcium  diet, regular exercise, and multivitamin daily. Labs with PCP.   Encounter for routine checking of intrauterine contraceptive device (IUD) - Liletta  IUD 05/2015, amenorrheic. Will check FSH, estradiol  today. If menopausal, she will return for removal.   Amenorrhea - Plan: Estradiol , Follicle stimulating hormone. Mild menopausal symptoms.   Screening for cervical cancer - 2002 LEEP, subsequent paps normal. 11/2021 ASCUS neg HPV. Will repeat at 3-year interval per guidelines.   Screening for breast cancer - Normal mammogram history.  Continue annual screenings.  Normal breast exam today.  Screening for colon cancer - 02/2021 colonoscopy. Will repeat at 10-year interval per GI  recommendation.   Return in about 1 year (around 03/12/2024) for Annual.      Annabella DELENA [facility], 8:27 AM 03/13/2023

## 2023-03-14 ENCOUNTER — Encounter: Payer: Self-pay | Admitting: Nurse Practitioner

## 2023-03-14 LAB — ESTRADIOL: Estradiol: 18 pg/mL

## 2023-03-14 LAB — FOLLICLE STIMULATING HORMONE: FSH: 52.2 m[IU]/mL

## 2023-04-16 ENCOUNTER — Telehealth: Payer: Self-pay | Admitting: Cardiology

## 2023-04-16 ENCOUNTER — Encounter: Payer: Self-pay | Admitting: Physician Assistant

## 2023-04-16 MED ORDER — AMLODIPINE BESYLATE 5 MG PO TABS: 5.0000 mg | ORAL_TABLET | Freq: Every day | ORAL | 0 refills | Status: DC

## 2023-04-16 NOTE — Telephone Encounter (Signed)
*  STAT* If patient is at the pharmacy, call can be transferred to refill team.   1. Which medications need to be refilled? (please list name of each medication and dose if known)   amLODipine (NORVASC) 5 MG tablet    2. Would you like to learn more about the convenience, safety, & potential cost savings by using the [facility]?   3. Are you open to using the [facility] (Type [facility]. ).   4. Which pharmacy/location (including street and city if local pharmacy) is medication to be sent to?  [id number] - [place], [place] - [address]   5. Do they need a 30 day or 90 day supply?     7 tablets  Patient stated she is out of town at a conference in [place], [place] and forgot her medication.  [facility] [address] [place], [place] [address].  Patient wants a call back to confirm medication sent.

## 2023-04-16 NOTE — Telephone Encounter (Signed)
 Pt's was called and confirmed that her medication had been sent to her pharmacy as requested and pt confirmed that she would be keeping her upcoming appt in March 2025. Confirmation received.

## 2023-04-22 NOTE — Progress Notes (Unsigned)
 Cardiology Office Note:  .   Date:  04/23/2023  ID:  Emma Barajas, DOB 11/13/1970, MRN [id number] PCP: Marikay Alar, FNP  [facility] Providers Cardiologist:  None {  History of Present Illness: .   Emma Barajas is a 53 y.o. female with past medical history of hypertension PVCs who presents to the clinic for follow-up appointment.  Was last seen by Dr. Shari Prows in March 2023.  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  Follow-up 2D echocardiogram May 2015 with LVEF 66 5%, grade 2 DD.  Was seen by Dr. Delton See in 2021 and was doing well.  Remains very active with no significant symptoms.  Underwent a calcium score 11/2019 which showed calcium score of 0.  When she was last seen by Dr. Shari Prows she was doing well and had not noticed any episodes of palpitations and denied any chest pain, SOB, nausea, vomiting, diaphoresis.  Very active without any anginal symptoms.  She enjoys hiking for activity.  Blood pressure is well-controlled and running mainly in the 120s over 80s.  Compliant with medications without issue.  Last LDL was 115 on Crestor 10 mg daily.  Family history includes maternal grandmother had cardiovascular disease starting in her 59s.  MI at 88 and died. Both her parents were diagnosed with cardiovascular disease in her 27s.  At her last visit she denied chest pain/pressure, dyspnea on exertion or at rest, PND, orthopnea, leg swelling.  No syncope or presyncope.  No dizziness or lightheadedness.  Today, she presents with a history of hyperlipidemia, for a follow-up visit. She has been on supplements since November to manage her lipid levels. The patient reports that her triglycerides have been increasing, with a recent level of 176 but more recently > 200, which is higher than the goal of under 150. The patient has been researching postmenopausal hormone replacements and we found a meta-analysis suggesting no significant difference in  primary outcomes of nonfatal myocardial infarction, cardiovascular death, or stroke between hormone therapy users and non-users. The patient is also due to have an IUD removed, which has been releasing hormones. The patient is active, enjoys hiking, and is planning a trip to [place] and [place].  Reports no shortness of breath nor dyspnea on exertion. Reports no chest pain, pressure, or tightness. No edema, orthopnea, PND. Reports no palpitations.    Discussed the use of AI scribe software for clinical note transcription with the patient, who gave verbal consent to proceed.   ROS: pertinent ROS in HPI  Studies Reviewed: Marland Kitchen   EKG Interpretation Date/Time:  Monday April 23 2023 08:08:05 EDT Ventricular Rate:  65 PR Interval:  146 QRS Duration:  82 QT Interval:  426 QTC Calculation: 443 R Axis:   80  Text Interpretation: Normal sinus rhythm Normal ECG When compared with ECG of 20-Dec-2015 04:39, No significant change was found Confirmed by Jari Favre [phone]) on 04/23/2023 8:35:58 AM    EXAM: OVER-READ INTERPRETATION  CT CHEST   The following report is an over-read performed by radiologist Dr. Trudie Reed of [facility] on 12/04/2019. This over-read does not include interpretation of cardiac or coronary anatomy or pathology. The coronary calcium score interpretation by the cardiologist is attached.   COMPARISON:  None.   FINDINGS: Within the visualized portions of the thorax there are no suspicious appearing pulmonary nodules or masses, there is no acute consolidative airspace disease, no pleural effusions, no pneumothorax and no lymphadenopathy. Visualized portions of  the upper abdomen are unremarkable. There are no aggressive appearing lytic or blastic lesions noted in the visualized portions of the skeleton.   IMPRESSION: No significant incidental noncardiac findings are noted.       Physical Exam:   VS:  BP 132/88   Pulse 65   Ht 5\' 7"  (1.702 m)    Wt 169 lb 12.8 oz (77 kg)   SpO2 100%   BMI 26.59 kg/m    Wt Readings from Last 3 Encounters:  04/23/23 169 lb 12.8 oz (77 kg)  03/13/23 166 lb (75.3 kg)  11/21/21 162 lb (73.5 kg)    GEN: Well nourished, well developed in no acute distress NECK: No JVD; No carotid bruits CARDIAC: RRR, no murmurs, rubs, gallops RESPIRATORY:  Clear to auscultation without rales, wheezing or rhonchi  ABDOMEN: Soft, non-tender, non-distended EXTREMITIES:  No edema; No deformity   ASSESSMENT AND PLAN: .   Hyperlipidemia LDL at 117 mg/dL, target 70 mg/dL due to family history. Triglycerides 176 mg/dL but now over 161. Prefers supplements over statins. Lipoprotein(a) test recommended for familial hyperlipidemia. - Order lipoprotein(a) test. - Order calcium score test. - Continue Cardiolipid and Glucoresolve. - Review lab results in April.  Family history of cardiovascular disease Maternal grandmother had myocardial infarction at 88, father had stroke and atrial fibrillation, increasing cardiovascular risk. - Monitor cardiovascular risk factors. - ordered familial hyperlipidemia assessment with lipoprotein(a) test.  Hypertension Blood pressure slightly elevated, likely due to stress. Needs regular home monitoring for accurate assessment. - Instruct her to monitor blood pressure at home for 1-2 weeks.  Follow-up Prefers female providers, transitioning to new provider due to clinic move. - Schedule follow-up in six months with Dr. Jacques Navy.      Signed, Sharlene Dory, PA-C

## 2023-04-23 ENCOUNTER — Ambulatory Visit: Payer: PRIVATE HEALTH INSURANCE | Attending: Physician Assistant | Admitting: Physician Assistant

## 2023-04-23 ENCOUNTER — Encounter: Payer: Self-pay | Admitting: Physician Assistant

## 2023-04-23 VITALS — BP 132/88 | HR 65 | Ht 67.0 in | Wt 169.8 lb

## 2023-04-23 DIAGNOSIS — Z8249 Family history of ischemic heart disease and other diseases of the circulatory system: Secondary | ICD-10-CM

## 2023-04-23 DIAGNOSIS — I10 Essential (primary) hypertension: Secondary | ICD-10-CM | POA: Diagnosis not present

## 2023-04-23 DIAGNOSIS — I493 Ventricular premature depolarization: Secondary | ICD-10-CM

## 2023-04-23 DIAGNOSIS — E785 Hyperlipidemia, unspecified: Secondary | ICD-10-CM

## 2023-04-23 NOTE — Patient Instructions (Addendum)
 Medication Instructions:  Your physician recommends that you continue on your current medications as directed. Please refer to the Current Medication list given to you today.  *If you need a refill on your cardiac medications before your next appointment, please call your pharmacy*   Lab Work: Lipoprotein A Fasting Lipid panel If you have labs (blood work) drawn today and your tests are completely normal, you will receive your results only by: MyChart Message (if you have MyChart) OR A paper copy in the mail If you have any lab test that is abnormal or we need to change your treatment, we will call you to review the results.   Testing/Procedures: Calcium Score Your physician has requested that you have a coronary calcium score performed. This is not covered by insurance and will be an out-of-pocket cost of approximately $99.    Follow-Up: At [facility], you and your health needs are our priority.  As part of our continuing mission to provide you with exceptional heart care, we have created designated Provider Care Teams.  These Care Teams include your primary Cardiologist (physician) and Advanced Practice Providers (APPs -  Physician Assistants and Nurse Practitioners) who all work together to provide you with the care you need, when you need it.  We recommend signing up for the patient portal called "MyChart".  Sign up information is provided on this After Visit Summary.  MyChart is used to connect with patients for Virtual Visits (Telemedicine).  Patients are able to view lab/test results, encounter notes, upcoming appointments, etc.  Non-urgent messages can be sent to your provider as well.   To learn more about what you can do with MyChart, go to [email or web address].    Your next appointment:   6 month(s)  Provider:   Jacques Navy, MD   Other Instructions   1st Floor: - Lobby - Registration  - Pharmacy  - Lab - Cafe  2nd Floor: - PV Lab - Diagnostic Testing  (echo, CT, nuclear med)  3rd Floor: - Vacant  4th Floor: - TCTS (cardiothoracic surgery) - AFib Clinic - Structural Heart Clinic - Vascular Surgery  - Vascular Ultrasound  5th Floor: - HeartCare Cardiology (general and EP) - Clinical Pharmacy for coumadin, hypertension, lipid, weight-loss medications, and med management appointments    Valet parking services will be available as well.

## 2023-05-01 ENCOUNTER — Ambulatory Visit (HOSPITAL_COMMUNITY)
Admission: RE | Admit: 2023-05-01 | Discharge: 2023-05-01 | Disposition: A | Discharge status: Home / Self Care | Payer: Self-pay | Source: Ambulatory Visit | Attending: Physician Assistant | Admitting: Physician Assistant

## 2023-05-01 ENCOUNTER — Encounter: Payer: Self-pay | Admitting: Physician Assistant

## 2023-05-01 DIAGNOSIS — E785 Hyperlipidemia, unspecified: Secondary | ICD-10-CM | POA: Insufficient documentation

## 2023-05-01 DIAGNOSIS — Z8249 Family history of ischemic heart disease and other diseases of the circulatory system: Secondary | ICD-10-CM | POA: Insufficient documentation

## 2023-05-01 DIAGNOSIS — I493 Ventricular premature depolarization: Secondary | ICD-10-CM | POA: Insufficient documentation

## 2023-05-01 DIAGNOSIS — I10 Essential (primary) hypertension: Secondary | ICD-10-CM | POA: Insufficient documentation

## 2023-05-01 LAB — LIPID PANEL
Chol/HDL Ratio: 2.2 ratio (ref 0.0–4.4)
Cholesterol, Total: 200 mg/dL — ABNORMAL HIGH (ref 100–199)
HDL: 93 mg/dL (ref >39)
LDL Chol Calc (NIH): 92 mg/dL (ref 0–99)
Triglycerides: 86 mg/dL (ref 0–149)
VLDL Cholesterol Cal: 15 mg/dL (ref 5–40)

## 2023-05-02 LAB — LIPOPROTEIN A (LPA): Lipoprotein (a): <8.4 nmol/L (ref <75.0)

## 2023-05-03 ENCOUNTER — Encounter: Payer: Self-pay | Admitting: Physician Assistant

## 2023-05-08 ENCOUNTER — Encounter: Payer: Self-pay | Admitting: Nurse Practitioner

## 2023-05-08 ENCOUNTER — Ambulatory Visit (INDEPENDENT_AMBULATORY_CARE_PROVIDER_SITE_OTHER): Admitting: Nurse Practitioner

## 2023-05-08 VITALS — BP 120/76 | HR 94 | Ht 66.5 in | Wt 168.0 lb

## 2023-05-08 DIAGNOSIS — N951 Menopausal and female climacteric states: Secondary | ICD-10-CM | POA: Diagnosis not present

## 2023-05-08 DIAGNOSIS — Z30432 Encounter for removal of intrauterine contraceptive device: Secondary | ICD-10-CM

## 2023-05-08 NOTE — Progress Notes (Signed)
   Acute Office Visit  Subjective:    Patient ID: Emma Barajas, female    DOB: 1970-10-10, 53 y.o.   MRN: [id number]   HPI 53 y.o. presents today for IUD removal. Expires this month. Perimenopausal. 03/2023 FSH 52, estradiol 18. Option for removal, exchange or alternative discussed. Wants to remove and see how she does. Having some perimenopausal symptoms.   No LMP recorded. (Menstrual status: IUD).    Review of Systems  Constitutional: Negative.   Genitourinary: Negative.        Objective:    Physical Exam Constitutional:      Appearance: Normal appearance.  Genitourinary:    General: Normal vulva.     Vagina: Normal.     Cervix: Normal.     Comments: IUD strings grasped with ring forceps and removed with ease    BP 120/76 (BP Location: Left Arm, Patient Position: Sitting, Cuff Size: Normal)   Pulse 94   Ht 5' 6.5" (1.689 m)   Wt 168 lb (76.2 kg)   SpO2 98%   BMI 26.71 kg/m  Wt Readings from Last 3 Encounters:  05/08/23 168 lb (76.2 kg)  04/23/23 169 lb 12.8 oz (77 kg)  03/13/23 166 lb (75.3 kg)        Patient informed chaperone available to be present for breast and/or pelvic exam. Patient has requested no chaperone to be present. Patient has been advised what will be completed during breast and pelvic exam.   Assessment & Plan:   Problem List Items Addressed This Visit   None Visit Diagnoses       Encounter for IUD removal    -  Primary   Relevant Orders   IUD removal     Perimenopausal          Plan: IUD removed, patient tolerated well.   Return if symptoms worsen or fail to improve.    Olivia Mackie DNP, 9:51 AM 05/08/2023

## 2023-05-10 ENCOUNTER — Other Ambulatory Visit: Payer: Self-pay

## 2023-05-10 MED ORDER — AMLODIPINE BESYLATE 5 MG PO TABS
5.0000 mg | ORAL_TABLET | Freq: Every day | ORAL | 3 refills | Status: DC
Start: 2023-05-10 — End: 2023-11-15

## 2023-05-22 ENCOUNTER — Ambulatory Visit: Admitting: Nurse Practitioner

## 2023-06-22 DIAGNOSIS — E559 Vitamin D deficiency, unspecified: Secondary | ICD-10-CM | POA: Diagnosis not present

## 2023-06-22 DIAGNOSIS — R5383 Other fatigue: Secondary | ICD-10-CM | POA: Diagnosis not present

## 2023-06-22 DIAGNOSIS — E7210 Disorders of sulfur-bearing amino-acid metabolism, unspecified: Secondary | ICD-10-CM | POA: Diagnosis not present

## 2023-06-22 DIAGNOSIS — R739 Hyperglycemia, unspecified: Secondary | ICD-10-CM | POA: Diagnosis not present

## 2023-06-22 DIAGNOSIS — R7989 Other specified abnormal findings of blood chemistry: Secondary | ICD-10-CM | POA: Diagnosis not present

## 2023-06-22 DIAGNOSIS — E611 Iron deficiency: Secondary | ICD-10-CM | POA: Diagnosis not present

## 2023-07-20 DIAGNOSIS — L210 Seborrhea capitis: Secondary | ICD-10-CM | POA: Diagnosis not present

## 2023-09-21 DIAGNOSIS — L821 Other seborrheic keratosis: Secondary | ICD-10-CM | POA: Diagnosis not present

## 2023-09-21 DIAGNOSIS — L814 Other melanin hyperpigmentation: Secondary | ICD-10-CM | POA: Diagnosis not present

## 2023-09-21 DIAGNOSIS — D225 Melanocytic nevi of trunk: Secondary | ICD-10-CM | POA: Diagnosis not present

## 2023-09-21 DIAGNOSIS — L578 Other skin changes due to chronic exposure to nonionizing radiation: Secondary | ICD-10-CM | POA: Diagnosis not present

## 2023-10-11 ENCOUNTER — Encounter: Payer: Self-pay | Admitting: Internal Medicine

## 2023-11-13 NOTE — Progress Notes (Unsigned)
 Cardiology Office Note:  .   Date:  11/15/2023  ID:  Emma Barajas, DOB 1970-09-22, MRN [id number] PCP: Artemus Darice RAMAN, FNP  [facility] Providers Cardiologist:  Soyla DELENA Merck, MD  APP: Orren Fabry, PA-C  History of Present Illness: .   Emma Barajas is a 53 y.o. female with past medical history of hypertension PVCs who presents to the clinic for follow-up appointment.  Was last seen by Dr. Hobart in March 2023.  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  Follow-up 2D echocardiogram May 2015 with LVEF 66 5%, grade 2 DD.  Was seen by Dr. Maranda in 2021 and was doing well.  Remains very active with no significant symptoms.  Underwent a calcium  score 11/2019 which showed calcium  score of 0.  When she was last seen by Dr. Hobart she was doing well and had not noticed any episodes of palpitations and denied any chest pain, SOB, nausea, vomiting, diaphoresis.  Very active without any anginal symptoms.  She enjoys hiking for activity.  Blood pressure is well-controlled and running mainly in the 120s over 80s.  Compliant with medications without issue.  Last LDL was 115 on Crestor  10 mg daily.  Family history includes maternal grandmother had cardiovascular disease starting in her 49s.  MI at 71 and died. Both her parents were diagnosed with cardiovascular disease in her 39s.  At her last visit she denied chest pain/pressure, dyspnea on exertion or at rest, PND, orthopnea, leg swelling.  No syncope or presyncope.  No dizziness or lightheadedness.  I saw her 04/2023, she presents with a history of hyperlipidemia, for a follow-up visit. She has been on supplements since November to manage her lipid levels. The patient reports that her triglycerides have been increasing, with a recent level of 176 but more recently > 200, which is higher than the goal of under 150. The patient has been researching postmenopausal hormone replacements and we found a  meta-analysis suggesting no significant difference in primary outcomes of nonfatal myocardial infarction, cardiovascular death, or stroke between hormone therapy users and non-users. The patient is also due to have an IUD removed, which has been releasing hormones. The patient is active, enjoys hiking, and is planning a trip to [place] and [place].  Reports no shortness of breath nor dyspnea on exertion. Reports no chest pain, pressure, or tightness. No edema, orthopnea, PND. Reports no palpitations.   Discussed the use of AI scribe software for clinical note transcription with the patient, who gave verbal consent to proceed.  Today, she presents for routine follow-up appointment.  She experiences no new cardiac symptoms and feels well. She occasionally uses a home EKG device (Kardia) and has not experienced palpitations or concerning symptoms. Her blood pressure is 120/82.  She is taking a supplement to lower her LDL cholesterol, which has decreased from 117 to 92, and her triglycerides are at 86. She has started hormone replacement therapy, which she believes may help with her lipid levels. She plans to have her lipids tested soon.  We have asked her to send us  a copy.  She is on amlodipine , provided by her current provider, and is not taking Crestor .  Her family history includes a maternal grandmother with a heart attack at age 41 and a father with stroke, atrial fibrillation, and cardiovascular disease. Her lipoprotein A level is less than 8.4.  She maintains an active lifestyle, engaging in hiking, swimming, and yoga.  Reports no shortness of  breath nor dyspnea on exertion. Reports no chest pain, pressure, or tightness. No edema, orthopnea, PND. Reports no palpitations.   Discussed the use of AI scribe software for clinical note transcription with the patient, who gave verbal consent to proceed.   ROS: pertinent ROS in HPI  Studies Reviewed: SABRA        EXAM: OVER-READ INTERPRETATION   CT CHEST   The following report is an over-read performed by radiologist Dr. Toribio Aye of [facility] on 12/04/2019. This over-read does not include interpretation of cardiac or coronary anatomy or pathology. The coronary calcium  score interpretation by the cardiologist is attached.   COMPARISON:  None.   FINDINGS: Within the visualized portions of the thorax there are no suspicious appearing pulmonary nodules or masses, there is no acute consolidative airspace disease, no pleural effusions, no pneumothorax and no lymphadenopathy. Visualized portions of the upper abdomen are unremarkable. There are no aggressive appearing lytic or blastic lesions noted in the visualized portions of the skeleton.   IMPRESSION: No significant incidental noncardiac findings are noted.       Physical Exam:   VS:  BP 120/82   Pulse 70   Ht 5' 7 (1.702 m)   Wt 164 lb (74.4 kg)   SpO2 97%   BMI 25.69 kg/m    Wt Readings from Last 3 Encounters:  11/15/23 164 lb (74.4 kg)  05/08/23 168 lb (76.2 kg)  04/23/23 169 lb 12.8 oz (77 kg)    GEN: Well nourished, well developed in no acute distress NECK: No JVD; No carotid bruits CARDIAC: RRR, no murmurs, rubs, gallops RESPIRATORY:  Clear to auscultation without rales, wheezing or rhonchi  ABDOMEN: Soft, non-tender, non-distended EXTREMITIES:  No edema; No deformity   ASSESSMENT AND PLAN: .    Essential hypertension Blood pressure controlled at 120/82 mmHg on amlodipine . - Refill amlodipine  for one year.  Hyperlipidemia LDL cholesterol improved to 92 mg/dL, meeting goal. Triglycerides satisfactory at 86 mg/dL. No familial hyperlipidemia evidence. Hormone replacement therapy may aid lipid management. - Have primary care provider send lipid panel copy when available.  Family history of cardiovascular disease Maternal grandmother had myocardial infarction at 60, father had stroke and atrial fibrillation, increasing cardiovascular  risk. - Monitor cardiovascular risk factors. - ordered familial hyperlipidemia assessment with lipoprotein(a) test.  Follow-up: Plan to see Dr. Loni in a year    Signed, Orren LOISE Fabry, PA-C

## 2023-11-15 ENCOUNTER — Ambulatory Visit: Payer: Self-pay | Attending: Cardiology | Admitting: Physician Assistant

## 2023-11-15 ENCOUNTER — Ambulatory Visit: Payer: Self-pay | Admitting: Physician Assistant

## 2023-11-15 ENCOUNTER — Encounter: Payer: Self-pay | Admitting: Physician Assistant

## 2023-11-15 VITALS — BP 120/82 | HR 70 | Ht 67.0 in | Wt 164.0 lb

## 2023-11-15 DIAGNOSIS — E785 Hyperlipidemia, unspecified: Secondary | ICD-10-CM | POA: Diagnosis not present

## 2023-11-15 DIAGNOSIS — Z8249 Family history of ischemic heart disease and other diseases of the circulatory system: Secondary | ICD-10-CM | POA: Diagnosis not present

## 2023-11-15 DIAGNOSIS — I10 Essential (primary) hypertension: Secondary | ICD-10-CM | POA: Diagnosis not present

## 2023-11-15 DIAGNOSIS — I493 Ventricular premature depolarization: Secondary | ICD-10-CM | POA: Diagnosis not present

## 2023-11-15 MED ORDER — AMLODIPINE BESYLATE 5 MG PO TABS: 5.0000 mg | ORAL_TABLET | Freq: Every day | ORAL | 3 refills | Status: AC

## 2023-11-15 NOTE — Patient Instructions (Addendum)
 Medication Instructions:   Your physician recommends that you continue on your current medications as directed. Please refer to the Current Medication list given to you today.   *If you need a refill on your cardiac medications before your next appointment, please call your pharmacy*    Lab Work:  MAKE SURE YOU  SEND COPY OF BLOOD  WORK  ( LIPID  PANEL) FROM PRIMARY PROVIDER TO [phone]  ATTN: TESSA CONTE     If you have labs (blood work) drawn today and your tests are completely normal, you will receive your results only by: MyChart Message (if you have MyChart) OR A paper copy in the mail If you have any lab test that is abnormal or we need to change your treatment, we will call you to review the results.   Testing/Procedures:  NONE ORDERED  TODAY     Follow-Up: At [facility], you and your health needs are our priority.  As part of our continuing mission to provide you with exceptional heart care, our providers are all part of one team.  This team includes your primary Cardiologist (physician) and Advanced Practice Providers or APPs (Physician Assistants and Nurse Practitioners) who all work together to provide you with the care you need, when you need it.   Your next appointment:   1 year(s)   Provider:   Soyla DELENA Merck, MD    We recommend signing up for the patient portal called MyChart.  Sign up information is provided on this After Visit Summary.  MyChart is used to connect with patients for Virtual Visits (Telemedicine).  Patients are able to view lab/test results, encounter notes, upcoming appointments, etc.  Non-urgent messages can be sent to your provider as well.   To learn more about what you can do with MyChart, go to [email or web address].   Other Instructions  Low-Sodium Eating Plan Salt (sodium) helps you keep a healthy balance of fluids in your body. Too much sodium can raise your blood pressure. It can also cause fluid and waste to be  held in your body. Your health care provider or dietitian may recommend a low-sodium eating plan if you have high blood pressure (hypertension), kidney disease, liver disease, or heart failure. Eating less sodium can help lower your blood pressure and reduce swelling. It can also protect your heart, liver, and kidneys. What are tips for following this plan? Reading food labels  Check food labels for the amount of sodium per serving. If you eat more than one serving, you must multiply the listed amount by the number of servings. Choose foods with less than 140 milligrams (mg) of sodium per serving. Avoid foods with 300 mg of sodium or more per serving. Always check how much sodium is in a product, even if the label says unsalted or no salt added. Shopping  Buy products labeled as low-sodium or no salt added. Buy fresh foods. Avoid canned foods and pre-made or frozen meals. Avoid canned, cured, or processed meats. Buy breads that have less than 80 mg of sodium per slice. Cooking  Eat more home-cooked food. Try to eat less restaurant, buffet, and fast food. Try not to add salt when you cook. Use salt-free seasonings or herbs instead of table salt or sea salt. Check with your provider or pharmacist before using salt substitutes. Cook with plant-based oils, such as canola, sunflower, or olive oil. Meal planning When eating at a restaurant, ask if your food can be made with less salt or no salt.  Avoid dishes labeled as brined, pickled, cured, or smoked. Avoid dishes made with soy sauce, miso, or teriyaki sauce. Avoid foods that have monosodium glutamate (MSG) in them. MSG may be added to some restaurant food, sauces, soups, bouillon, and canned foods. Make meals that can be grilled, baked, poached, roasted, or steamed. These are often made with less sodium. General information Try to limit your sodium intake to 1,500-2,300 mg each day, or the amount told by your provider. What foods  should I eat? Fruits Fresh, frozen, or canned fruit. Fruit juice. Vegetables Fresh or frozen vegetables. No salt added canned vegetables. No salt added tomato sauce and paste. Low-sodium or reduced-sodium tomato and vegetable juice. Grains Low-sodium cereals, such as oats, puffed wheat and rice, and shredded wheat. Low-sodium crackers. Unsalted rice. Unsalted pasta. Low-sodium bread. Whole grain breads and whole grain pasta. Meats and other proteins Fresh or frozen meat, poultry, seafood, and fish. These should have no added salt. Low-sodium canned tuna and salmon. Unsalted nuts. Dried peas, beans, and lentils without added salt. Unsalted canned beans. Eggs. Unsalted nut butters. Dairy Milk. Soy milk. Cheese that is naturally low in sodium, such as ricotta cheese, fresh mozzarella, or Swiss cheese. Low-sodium or reduced-sodium cheese. Cream cheese. Yogurt. Seasonings and condiments Fresh and dried herbs and spices. Salt-free seasonings. Low-sodium mustard and ketchup. Sodium-free salad dressing. Sodium-free light mayonnaise. Fresh or refrigerated horseradish. Lemon juice. Vinegar. Other foods Homemade, reduced-sodium, or low-sodium soups. Unsalted popcorn and pretzels. Low-salt or salt-free chips. The items listed above may not be all the foods and drinks you can have. Talk to a dietitian to learn more. What foods should I avoid? Vegetables Sauerkraut, pickled vegetables, and relishes. Olives. [place] fries. Onion rings. Regular canned vegetables, except low-sodium or reduced-sodium items. Regular canned tomato sauce and paste. Regular tomato and vegetable juice. Frozen vegetables in sauces. Grains Instant hot cereals. Bread stuffing, pancake, and biscuit mixes. Croutons. Seasoned rice or pasta mixes. Noodle soup cups. Boxed or frozen macaroni and cheese. Regular salted crackers. Self-rising flour. Meats and other proteins Meat or fish that is salted, canned, smoked, spiced, or pickled.  Precooked or cured meat, such as sausages or meat loaves. [place]. Ham. Pepperoni. Hot dogs. Corned beef. Chipped beef. Salt pork. Jerky. Pickled herring, anchovies, and sardines. Regular canned tuna. Salted nuts. Dairy Processed cheese and cheese spreads. Hard cheeses. Cheese curds. Blue cheese. Feta cheese. String cheese. Regular cottage cheese. Buttermilk. Canned milk. Fats and oils Salted butter. Regular margarine. Ghee. Bacon fat. Seasonings and condiments Onion salt, garlic salt, seasoned salt, table salt, and sea salt. Canned and packaged gravies. Worcestershire sauce. Tartar sauce. Barbecue sauce. Teriyaki sauce. Soy sauce, including reduced-sodium soy sauce. Steak sauce. Fish sauce. Oyster sauce. Cocktail sauce. Horseradish that you find on the shelf. Regular ketchup and mustard. Meat flavorings and tenderizers. Bouillon cubes. Hot sauce. Pre-made or packaged marinades. Pre-made or packaged taco seasonings. Relishes. Regular salad dressings. Salsa. Other foods Salted popcorn and pretzels. Corn chips and puffs. Potato and tortilla chips. Canned or dried soups. Pizza. Frozen entrees and pot pies. The items listed above may not be all the foods and drinks you should avoid. Talk to a dietitian to learn more. This information is not intended to replace advice given to you by your health care provider. Make sure you discuss any questions you have with your health care provider. Document Revised: 02/09/2022 Document Reviewed: 02/09/2022 Elsevier Patient Education  2024 [employer].

## 2024-01-08 DIAGNOSIS — Z1231 Encounter for screening mammogram for malignant neoplasm of breast: Secondary | ICD-10-CM | POA: Diagnosis not present

## 2024-01-08 LAB — HM MAMMOGRAPHY

## 2024-01-09 ENCOUNTER — Encounter: Payer: Self-pay | Admitting: Nurse Practitioner

## 2024-03-20 ENCOUNTER — Ambulatory Visit: Admitting: Nurse Practitioner
